# Patient Record
Sex: Female | Born: 1970
Health system: Southern US, Community
[De-identification: ages and names within clinical notes are randomized; demographics above are authoritative.]

## PROBLEM LIST (undated history)

## (undated) DIAGNOSIS — E538 Deficiency of other specified B group vitamins: Secondary | ICD-10-CM

## (undated) DIAGNOSIS — G250 Essential tremor: Secondary | ICD-10-CM

## (undated) DIAGNOSIS — E785 Hyperlipidemia, unspecified: Secondary | ICD-10-CM

## (undated) HISTORY — DX: Deficiency of other specified B group vitamins: E53.8

## (undated) HISTORY — DX: Essential tremor: G25.0

## (undated) HISTORY — DX: Hyperlipidemia, unspecified: E78.5

---

## 2007-06-17 HISTORY — PX: BREAST EXCISIONAL BIOPSY: SUR124

## 2010-06-04 ENCOUNTER — Encounter
Admission: RE | Admit: 2010-06-04 | Discharge: 2010-06-04 | Payer: Self-pay | Source: Home / Self Care | Attending: Family Medicine | Admitting: Family Medicine

## 2015-09-21 ENCOUNTER — Other Ambulatory Visit: Payer: Self-pay | Admitting: Family Medicine

## 2015-09-21 DIAGNOSIS — Z1231 Encounter for screening mammogram for malignant neoplasm of breast: Secondary | ICD-10-CM

## 2015-12-28 ENCOUNTER — Ambulatory Visit: Payer: Self-pay | Attending: Internal Medicine

## 2016-01-31 ENCOUNTER — Ambulatory Visit: Payer: Self-pay | Admitting: Family Medicine

## 2016-02-01 ENCOUNTER — Ambulatory Visit: Payer: Medicaid Other | Attending: Internal Medicine

## 2016-02-05 ENCOUNTER — Encounter: Payer: Self-pay | Admitting: Family Medicine

## 2016-02-05 ENCOUNTER — Ambulatory Visit (INDEPENDENT_AMBULATORY_CARE_PROVIDER_SITE_OTHER): Payer: No Typology Code available for payment source | Admitting: Family Medicine

## 2016-02-05 VITALS — BP 117/71 | HR 83 | Temp 98.5°F | Resp 18 | Ht 61.0 in | Wt 157.0 lb

## 2016-02-05 DIAGNOSIS — Z0000 Encounter for general adult medical examination without abnormal findings: Secondary | ICD-10-CM

## 2016-02-05 LAB — CBC WITH DIFFERENTIAL/PLATELET
BASOS ABS: 0 {cells}/uL (ref 0–200)
Basophils Relative: 0 %
EOS ABS: 204 {cells}/uL (ref 15–500)
Eosinophils Relative: 2 %
HEMATOCRIT: 36.6 % (ref 35.0–45.0)
Hemoglobin: 11.7 g/dL (ref 11.7–15.5)
LYMPHS PCT: 18 %
Lymphs Abs: 1836 cells/uL (ref 850–3900)
MCH: 26.2 pg — ABNORMAL LOW (ref 27.0–33.0)
MCHC: 32 g/dL (ref 32.0–36.0)
MCV: 82.1 fL (ref 80.0–100.0)
MONO ABS: 714 {cells}/uL (ref 200–950)
MPV: 9.5 fL (ref 7.5–12.5)
Monocytes Relative: 7 %
NEUTROS PCT: 73 %
Neutro Abs: 7446 cells/uL (ref 1500–7800)
Platelets: 371 10*3/uL (ref 140–400)
RBC: 4.46 MIL/uL (ref 3.80–5.10)
RDW: 14 % (ref 11.0–15.0)
WBC: 10.2 10*3/uL (ref 3.8–10.8)

## 2016-02-05 LAB — LIPID PANEL
Cholesterol: 188 mg/dL (ref 125–200)
HDL: 56 mg/dL (ref >=46)
LDL Cholesterol: 105 mg/dL (ref <130)
TRIGLYCERIDES: 133 mg/dL (ref <150)
Total CHOL/HDL Ratio: 3.4 Ratio (ref <=5.0)
VLDL: 27 mg/dL (ref <30)

## 2016-02-05 LAB — COMPLETE METABOLIC PANEL WITH GFR
ALK PHOS: 71 U/L (ref 33–115)
ALT: 12 U/L (ref 6–29)
AST: 14 U/L (ref 10–35)
Albumin: 3.5 g/dL — ABNORMAL LOW (ref 3.6–5.1)
BUN: 13 mg/dL (ref 7–25)
CALCIUM: 8.4 mg/dL — AB (ref 8.6–10.2)
CHLORIDE: 104 mmol/L (ref 98–110)
CO2: 24 mmol/L (ref 20–31)
CREATININE: 0.67 mg/dL (ref 0.50–1.10)
GFR, Est Non African American: >89 mL/min (ref >=60)
Glucose, Bld: 88 mg/dL (ref 65–99)
POTASSIUM: 4.1 mmol/L (ref 3.5–5.3)
Sodium: 138 mmol/L (ref 135–146)
Total Bilirubin: 0.2 mg/dL (ref 0.2–1.2)
Total Protein: 6 g/dL — ABNORMAL LOW (ref 6.1–8.1)

## 2016-02-05 MED ORDER — BACLOFEN 10 MG PO TABS: 10.0000 mg | ORAL_TABLET | Freq: Three times a day (TID) | ORAL | 0 refills | Status: DC

## 2016-02-05 MED ORDER — NAPROXEN 500 MG PO TABS: 500.0000 mg | ORAL_TABLET | Freq: Two times a day (BID) | ORAL | 1 refills | Status: DC

## 2016-02-05 NOTE — Progress Notes (Signed)
Patient is here to establish care  Patient denies pain at this time.  Patient is not taking any current medications.

## 2016-02-06 LAB — HEMOGLOBIN A1C
HEMOGLOBIN A1C: 5.4 % (ref <5.7)
MEAN PLASMA GLUCOSE: 108 mg/dL

## 2016-02-06 LAB — HIV ANTIBODY (ROUTINE TESTING W REFLEX): HIV: NONREACTIVE

## 2016-02-06 NOTE — Patient Instructions (Signed)
We will let you know if anything in your bloodwork needs attention Will refer to dentist. s

## 2016-02-06 NOTE — Progress Notes (Signed)
Madison MortonMiguelina Risk, is a 45 y.o. female  ZOX:[id number]:[id number]  WJX:[id number]  DOB - 1971/03/03  CC:  Chief Complaint  Patient presents with  . Establish Care       HPI: Madison Moses is a 45 y.o. female here to establish care. She c/o of chronic back pain with intermittent spasms.  She is in need of health maintenance.  She reports her Tdap is up to date. She declines influenza, She needs screening for diabetes with A1C. She needs screening for HIV. She reports having a PAP in January.She is in need  Of a mammogram.She also asks for a referral to eye doctor. She is currently on no regular medications. She denies tobacco, alcohol and drug use.  No Known Allergies History reviewed. No pertinent past medical history. No current outpatient prescriptions on file prior to visit.   No current facility-administered medications on file prior to visit.    History reviewed. No pertinent family history. Social History   Social History  . Marital status: Married    Spouse name: N/A  . Number of children: N/A  . Years of education: N/A   Occupational History  . Not on file.   Social History Main Topics  . Smoking status: Never Smoker  . Smokeless tobacco: Never Used  . Alcohol use Not on file  . Drug use: No  . Sexual activity: Yes   Other Topics Concern  . Not on file   Social History Narrative  . No narrative on file    Review of Systems: Constitutional: Negative for fever, chills, appetite change, Fatigue.She reports a 20# weight gain. Skin: Negative for rashes or lesions of concern. HENT: Negative for ear pain, ear discharge.nose bleeds Eyes: Negative for pain, discharge, redness, itching and visual disturbance. Neck: Negative for pain, stiffness Respiratory: Negative for cough, shortness of breath,   Cardiovascular: Negative for chest pain, palpitations and leg swelling. Gastrointestinal: Negative for abdominal pain, nausea, vomiting, diarrhea. Positive for  constipation and heartburn Genitourinary: Negative for dysuria, urgency, frequency, hematuria,  Musculoskeletal: Positive for back pain. Negative for other joint pain, joint  swelling, and gait problem.Negative for weakness. Neurological: Negative for dizziness, tremors, seizures, syncope,   light-headedness, numbness and headaches.  Hematological: Negative for easy bruising or bleeding Psychiatric/Behavioral: Negative for depression, anxiety, decreased concentration, confusion   Objective:   Vitals:   02/05/16 1355  BP: 117/71  Pulse: 83  Resp: 18  Temp: 98.5 F (36.9 C)    Physical Exam: Constitutional: Patient appears well-developed and well-nourished. No distress. HENT: Normocephalic, atraumatic, External right and left ear normal. Oropharynx is clear and moist.  Eyes: Conjunctivae and EOM are normal. PERRLA, no scleral icterus. Neck: Normal ROM. Neck supple. No lymphadenopathy, No thyromegaly. CVS: RRR, S1/S2 +, no murmurs, no gallops, no rubs Pulmonary: Effort and breath sounds normal, no stridor, rhonchi, wheezes, rales.  Abdominal: Soft. Normoactive BS,, no distension, tenderness, rebound or guarding.  Musculoskeletal: Normal range of motion. No edema and no tenderness.  Neuro: Alert.Normal muscle tone coordination. Non-focal Skin: Skin is warm and dry. No rash noted. Not diaphoretic. No erythema. No pallor. Psychiatric: Normal mood and affect. Behavior, judgment, thought content normal.  Lab Results  Component Value Date   WBC 10.2 02/05/2016   HGB 11.7 02/05/2016   HCT 36.6 02/05/2016   MCV 82.1 02/05/2016   PLT 371 02/05/2016   Lab Results  Component Value Date   CREATININE 0.67 02/05/2016   BUN 13 02/05/2016   NA 138 02/05/2016   K  4.1 02/05/2016   CL 104 02/05/2016   CO2 24 02/05/2016    Lab Results  Component Value Date   HGBA1C 5.4 02/05/2016   Lipid Panel     Component Value Date/Time   CHOL 188 02/05/2016 1426   TRIG 133 02/05/2016 1426    HDL 56 02/05/2016 1426   CHOLHDL 3.4 02/05/2016 1426   VLDL 27 02/05/2016 1426   LDLCALC 105 02/05/2016 1426       Assessment and plan:   1. Healthcare maintenance  - CBC with Differential - COMPLETE METABOLIC PANEL WITH GFR - Hemoglobin A1c - Lipid panel - HIV antibody (with reflex) - Ambulatory referral to Dentistry - Ambulatory referral to Ophthalmology  2. Back pain with spasms -naproxen 500 mg, one po bid with food -baclofen 10 mg, one po tid prn spasms.   Return in about 6 months (around 08/07/2016).  The patient was given clear instructions to go to ER or return to medical center if symptoms don't improve, worsen or new problems develop. The patient verbalized understanding.    Henrietta HooverLinda C Bernhardt FNP  02/06/2016, 2:34 PM

## 2016-03-10 MED FILL — NAPROXEN 500 MG TABLET: 500 | 15 days supply | Qty: 30 | Fill #0

## 2016-03-10 MED FILL — BACLOFEN 10 MG TABLET: 10 | 10 days supply | Qty: 30 | Fill #0

## 2016-05-14 ENCOUNTER — Ambulatory Visit: Payer: Self-pay | Attending: Internal Medicine

## 2016-08-11 ENCOUNTER — Ambulatory Visit (INDEPENDENT_AMBULATORY_CARE_PROVIDER_SITE_OTHER): Payer: No Typology Code available for payment source | Admitting: Family Medicine

## 2016-08-11 ENCOUNTER — Encounter: Payer: Self-pay | Admitting: Family Medicine

## 2016-08-11 VITALS — BP 132/72 | HR 79 | Temp 98.0°F | Resp 12 | Ht 61.0 in | Wt 151.0 lb

## 2016-08-11 DIAGNOSIS — R1011 Right upper quadrant pain: Secondary | ICD-10-CM

## 2016-08-11 DIAGNOSIS — K219 Gastro-esophageal reflux disease without esophagitis: Secondary | ICD-10-CM

## 2016-08-11 LAB — COMPLETE METABOLIC PANEL WITH GFR
ALT: 11 U/L (ref 6–29)
AST: 14 U/L (ref 10–35)
Albumin: 3.8 g/dL (ref 3.6–5.1)
Alkaline Phosphatase: 63 U/L (ref 33–115)
BILIRUBIN TOTAL: 0.2 mg/dL (ref 0.2–1.2)
BUN: 13 mg/dL (ref 7–25)
CALCIUM: 9.3 mg/dL (ref 8.6–10.2)
CHLORIDE: 104 mmol/L (ref 98–110)
CO2: 27 mmol/L (ref 20–31)
CREATININE: 0.64 mg/dL (ref 0.50–1.10)
GFR, Est African American: >89 mL/min (ref >=60)
GFR, Est Non African American: >89 mL/min (ref >=60)
Glucose, Bld: 106 mg/dL — ABNORMAL HIGH (ref 65–99)
Potassium: 4.3 mmol/L (ref 3.5–5.3)
Sodium: 139 mmol/L (ref 135–146)
TOTAL PROTEIN: 6.2 g/dL (ref 6.1–8.1)

## 2016-08-11 LAB — LIPID PANEL
Cholesterol: 194 mg/dL (ref <200)
HDL: 52 mg/dL (ref >50)
LDL CALC: 122 mg/dL — AB (ref <100)
Total CHOL/HDL Ratio: 3.7 Ratio (ref <5.0)
Triglycerides: 101 mg/dL (ref <150)
VLDL: 20 mg/dL (ref <30)

## 2016-08-11 LAB — CBC WITH DIFFERENTIAL/PLATELET
Basophils Absolute: 86 cells/uL (ref 0–200)
Basophils Relative: 1 %
EOS ABS: 172 {cells}/uL (ref 15–500)
Eosinophils Relative: 2 %
HEMATOCRIT: 37.4 % (ref 35.0–45.0)
Hemoglobin: 12.1 g/dL (ref 11.7–15.5)
LYMPHS PCT: 25 %
Lymphs Abs: 2150 cells/uL (ref 850–3900)
MCH: 26.3 pg — ABNORMAL LOW (ref 27.0–33.0)
MCHC: 32.4 g/dL (ref 32.0–36.0)
MCV: 81.3 fL (ref 80.0–100.0)
MONO ABS: 516 {cells}/uL (ref 200–950)
MPV: 9.6 fL (ref 7.5–12.5)
Monocytes Relative: 6 %
NEUTROS PCT: 66 %
Neutro Abs: 5676 cells/uL (ref 1500–7800)
Platelets: 356 10*3/uL (ref 140–400)
RBC: 4.6 MIL/uL (ref 3.80–5.10)
RDW: 13.8 % (ref 11.0–15.0)
WBC: 8.6 10*3/uL (ref 3.8–10.8)

## 2016-08-11 MED ORDER — OMEPRAZOLE 20 MG PO CPDR: 20.0000 mg | DELAYED_RELEASE_CAPSULE | Freq: Every day | ORAL | 3 refills | Status: DC

## 2016-08-11 MED FILL — OMEPRAZOLE DR 20 MG CAPSULE: 20 | 30 days supply | Qty: 30 | Fill #0

## 2016-08-11 NOTE — Progress Notes (Signed)
Madison Moses, is a 46 y.o. female  ZOX:[id number]  WJX:[id number]  DOB - 1970/08/24  CC:  Chief Complaint  Patient presents with  . Abdominal Pain    [place], had pain, had ultrasound, showed gallstones, still has episodes of pain and needs referral to evaluate this and possible surgery       HPI: Madison Moses is a 46 y.o. female here for routine 6 month follow-up.  She is on no current medication.  She was recently out of the country, had abdominal pain, had imaging studies and was told she had gallstones. She has continued to have some intermittent symptoms since her return. She reports RUQ pain with radiation to right rib cage and shoulder, some nausea and bloating. She offers up no other complaints. She did have some positives when prompted by her review of systems, which is listed below.  Health Maintenance.  Declines Tdap and flu shot. She is in need of mammogram and PAP. Have given her information from [facility].   No Known Allergies History reviewed. No pertinent past medical history. No current outpatient prescriptions on file prior to visit.   No current facility-administered medications on file prior to visit.    History reviewed. No pertinent family history. Social History   Social History  . Marital status: Married    Spouse name: N/A  . Number of children: N/A  . Years of education: N/A   Occupational History  . Not on file.   Social History Main Topics  . Smoking status: Never Smoker  . Smokeless tobacco: Never Used  . Alcohol use No  . Drug use: No  . Sexual activity: Yes   Other Topics Concern  . Not on file   Social History Narrative  . No narrative on file    Review of Systems: Constitutional: Negative Skin: Negative HENT: Negative  Eyes: Negative  Neck: Negative Respiratory: Negative Cardiovascular: + occ palpitations Gastrointestinal: + abd pain, distention, bloating, nausea, particularly after  lunch Genitourinary: Negative  Musculoskeletal: + rib cage and shoulder pain, which may be MS or related to gallstones. Neurological: Negative  Hematological: Negative  Psychiatric/Behavioral: Negative    Objective:   Vitals:   08/11/16 1439  BP: 132/72  Pulse: 79  Resp: 12  Temp: 98 F (36.7 C)    Physical Exam: Constitutional: Patient appears well-developed and well-nourished. No distress. HENT: Normocephalic, atraumatic, External right and left ear normal. Oropharynx is clear and moist.  Eyes: Conjunctivae and EOM are normal. PERRLA, no scleral icterus. Neck: Normal ROM. Neck supple. No lymphadenopathy, No thyromegaly. CVS: RRR, S1/S2 +, no murmurs, no gallops, no rubs Pulmonary: Effort and breath sounds normal, no stridor, rhonchi, wheezes, rales.  Abdominal: Soft. Normoactive BS,, no distension, rebound or guarding. + for mild generalized tenderness Musculoskeletal: Normal range of motion. No edema and no tenderness.  Neuro: Alert.Normal muscle tone coordination. Non-focal Skin: Skin is warm and dry. No rash noted. Not diaphoretic. No erythema. No pallor. Psychiatric: Normal mood and affect. Behavior, judgment, thought content normal.  Lab Results  Component Value Date   WBC 10.2 02/05/2016   HGB 11.7 02/05/2016   HCT 36.6 02/05/2016   MCV 82.1 02/05/2016   PLT 371 02/05/2016   Lab Results  Component Value Date   CREATININE 0.67 02/05/2016   BUN 13 02/05/2016   NA 138 02/05/2016   K 4.1 02/05/2016   CL 104 02/05/2016   CO2 24 02/05/2016    Lab Results  Component Value Date  HGBA1C 5.4 02/05/2016   Lipid Panel     Component Value Date/Time   CHOL 188 02/05/2016 1426   TRIG 133 02/05/2016 1426   HDL 56 02/05/2016 1426   CHOLHDL 3.4 02/05/2016 1426   VLDL 27 02/05/2016 1426   LDLCALC 105 02/05/2016 1426        Assessment and plan:   1. Right upper quadrant abdominal pain  - COMPLETE METABOLIC PANEL WITH GFR - CBC with Differential - Lipid  panel - US Abdomen Limited RUQ; Future  2. Gastroesophageal reflux disease, esophagitis presence not specified  - omeprazole (PRILOSEC) 20 MG capsule; Take 1 capsule (20 mg total) by mouth daily.  Dispense: 30 capsule; Refill: 3  3. Need for mammogram and PAP smear - information provided for [facility]   Return in about 3 months (around 11/08/2016).  The patient was given clear instructions to go to ER or return to medical center if symptoms don't improve, worsen or new problems develop. The patient verbalized understanding.    Henrietta HooverLinda C Bernhardt FNP  08/11/2016, 3:18 PM

## 2016-08-11 NOTE — Patient Instructions (Addendum)
Will call if anything needed about gallbladder.  Call number provided to schedule mammogram and PAP smear.

## 2016-08-14 ENCOUNTER — Ambulatory Visit (HOSPITAL_COMMUNITY)
Admission: RE | Admit: 2016-08-14 | Discharge: 2016-08-14 | Disposition: A | Discharge status: Home / Self Care | Payer: No Typology Code available for payment source | Source: Ambulatory Visit | Attending: Family Medicine | Admitting: Family Medicine

## 2016-08-14 DIAGNOSIS — K8020 Calculus of gallbladder without cholecystitis without obstruction: Secondary | ICD-10-CM | POA: Insufficient documentation

## 2016-08-14 DIAGNOSIS — R1011 Right upper quadrant pain: Secondary | ICD-10-CM

## 2016-08-18 ENCOUNTER — Telehealth: Payer: Self-pay

## 2016-08-18 NOTE — Telephone Encounter (Signed)
-----   Message from Henrietta HooverLinda C Bernhardt, NP sent at 08/18/2016  8:07 AM EST ----- US shows gallstones but she has orange card. We can refer to surgery but she will have to pay. Any suggestions?

## 2016-08-18 NOTE — Telephone Encounter (Signed)
Called and spoke with patient, advised of ultrasound showing gallstones and that she would need to be referred to a general surgeon for follow up. Patient only has orange card at this time and wants to discuss this with husband before referral is made. Thanks!

## 2016-09-10 ENCOUNTER — Other Ambulatory Visit: Payer: Self-pay | Admitting: Obstetrics and Gynecology

## 2016-09-10 DIAGNOSIS — Z1231 Encounter for screening mammogram for malignant neoplasm of breast: Secondary | ICD-10-CM

## 2016-09-11 ENCOUNTER — Encounter (HOSPITAL_COMMUNITY): Payer: Self-pay

## 2016-09-11 ENCOUNTER — Ambulatory Visit (HOSPITAL_COMMUNITY)
Admission: RE | Admit: 2016-09-11 | Discharge: 2016-09-11 | Disposition: A | Discharge status: Home / Self Care | Payer: Self-pay | Source: Ambulatory Visit | Attending: Obstetrics and Gynecology | Admitting: Obstetrics and Gynecology

## 2016-09-11 ENCOUNTER — Ambulatory Visit
Admission: RE | Admit: 2016-09-11 | Discharge: 2016-09-11 | Disposition: A | Discharge status: Home / Self Care | Payer: Self-pay | Source: Ambulatory Visit | Attending: Obstetrics and Gynecology | Admitting: Obstetrics and Gynecology

## 2016-09-11 VITALS — BP 126/82 | Temp 98.4°F | Ht 62.0 in | Wt 149.8 lb

## 2016-09-11 DIAGNOSIS — Z1239 Encounter for other screening for malignant neoplasm of breast: Secondary | ICD-10-CM

## 2016-09-11 DIAGNOSIS — Z1231 Encounter for screening mammogram for malignant neoplasm of breast: Secondary | ICD-10-CM

## 2016-09-11 NOTE — Patient Instructions (Signed)
Explained breast self awareness with Madison MortonMiguelina Moses. Patient did not need a Pap smear today due to last Pap smear was in January 2017 per patient. Let her know [facility] will cover Pap smears every 3 years unless has a history of abnormal Pap smears. [facility] Appointment scheduled for Thursday, September 11, 2016 at 1610. Let patient know the Breast Center will follow up with her within the next couple weeks with results of mammogram by letter or phone. Madison MortonMiguelina Moses verbalized understanding.  Madison Moses, Madison Maserhristine Poll, RN 4:14 PM

## 2016-09-11 NOTE — Progress Notes (Signed)
No complaints today.   Pap Smear:  Pap smear not completed today. Last Pap smear was in January 2017 and normal per patient. Per patient has no history of an abnormal Pap smear. No Pap smear results are in EPIC.  Physical exam: Breasts Breasts symmetrical. No skin abnormalities bilateral breasts. No nipple retraction bilateral breasts. No nipple discharge bilateral breasts. No lymphadenopathy. No lumps palpated bilateral breasts. No complaints of pain or tenderness on exam. [facility] mamm[facility]. Appointment scheduled for Thursday, September 11, 2016 at 1610.        Pelvic/Bimanual No Pap smear completed today since last Pap smear was in January 2017 per patient. Pap smear not indicated per [facility] guidelines.   Smoking History: Patient has never smoked.  Patient Navigation: Patient education provided. [facility].

## 2016-09-12 ENCOUNTER — Encounter (HOSPITAL_COMMUNITY): Payer: Self-pay | Admitting: *Deleted

## 2016-09-15 ENCOUNTER — Other Ambulatory Visit: Payer: Self-pay | Admitting: Obstetrics and Gynecology

## 2016-09-15 DIAGNOSIS — R928 Other abnormal and inconclusive findings on diagnostic imaging of breast: Secondary | ICD-10-CM

## 2016-09-25 ENCOUNTER — Ambulatory Visit
Admission: RE | Admit: 2016-09-25 | Discharge: 2016-09-25 | Disposition: A | Discharge status: Home / Self Care | Payer: No Typology Code available for payment source | Source: Ambulatory Visit | Attending: Obstetrics and Gynecology | Admitting: Obstetrics and Gynecology

## 2016-09-25 DIAGNOSIS — R928 Other abnormal and inconclusive findings on diagnostic imaging of breast: Secondary | ICD-10-CM

## 2016-09-29 ENCOUNTER — Other Ambulatory Visit: Payer: Self-pay

## 2016-09-29 DIAGNOSIS — Z0000 Encounter for general adult medical examination without abnormal findings: Secondary | ICD-10-CM

## 2016-10-01 ENCOUNTER — Ambulatory Visit: Payer: No Typology Code available for payment source

## 2016-10-01 ENCOUNTER — Other Ambulatory Visit: Payer: No Typology Code available for payment source

## 2016-10-03 ENCOUNTER — Ambulatory Visit: Payer: No Typology Code available for payment source

## 2016-11-11 ENCOUNTER — Ambulatory Visit: Payer: No Typology Code available for payment source | Admitting: Family Medicine

## 2017-05-08 IMAGING — US US ABDOMEN LIMITED
1 series · 14 of 25 positions shown · non-contrast
Comparison: None.

CLINICAL DATA: RIGHT upper quadrant pain.

EXAM:
US ABDOMEN LIMITED - RIGHT UPPER QUADRANT

[Series 1: us abdomen limited · 0.19mm/px · 14 of 61 slices shown]
[im 1/61]
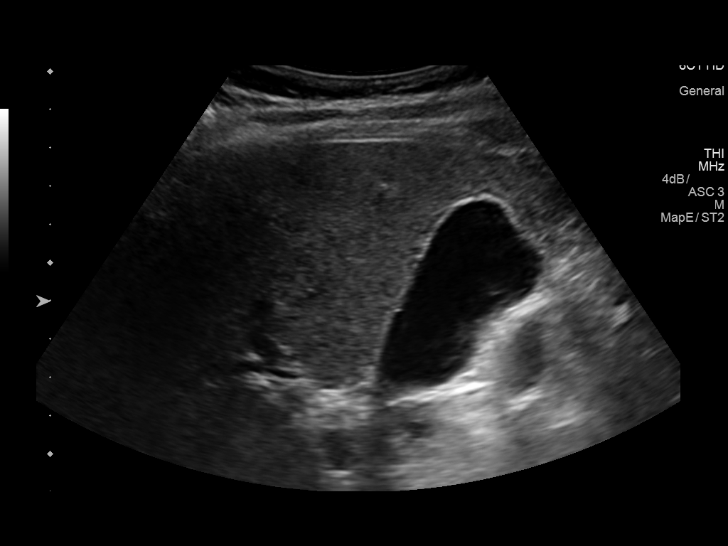
[im 6/61]
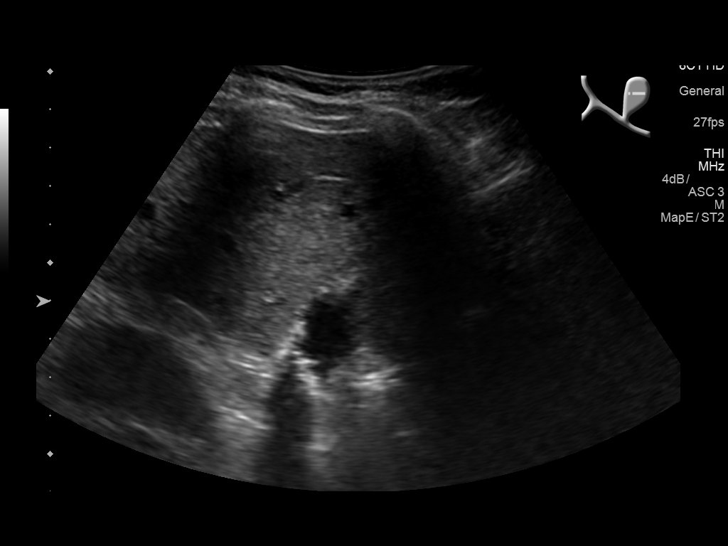
[im 11/61]
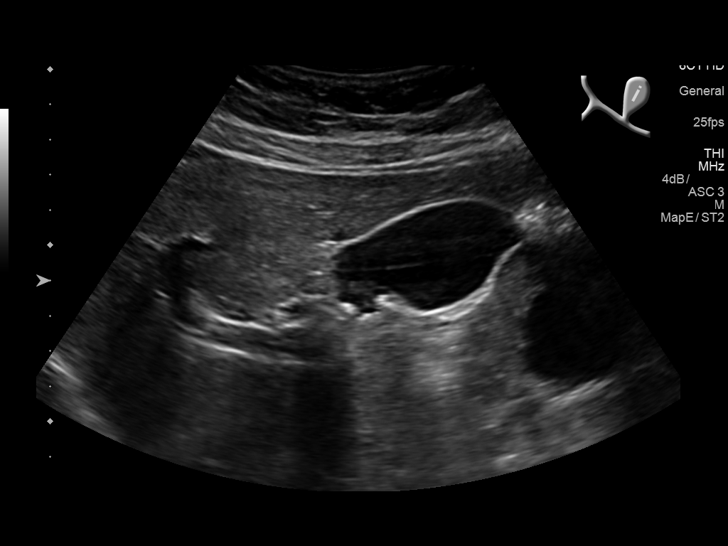
[im 16/61]
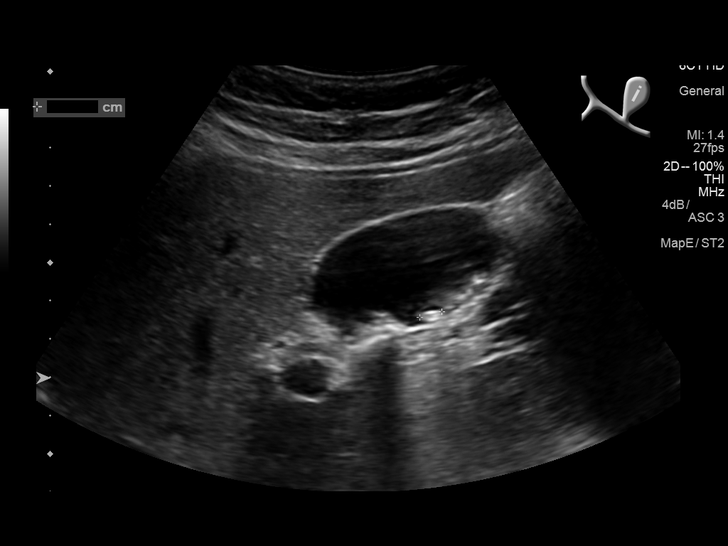
[im 21/61]
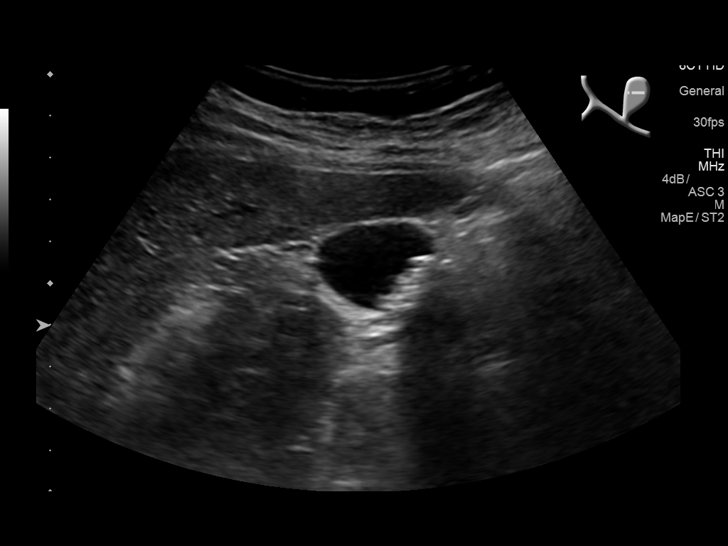
[im 23/61]
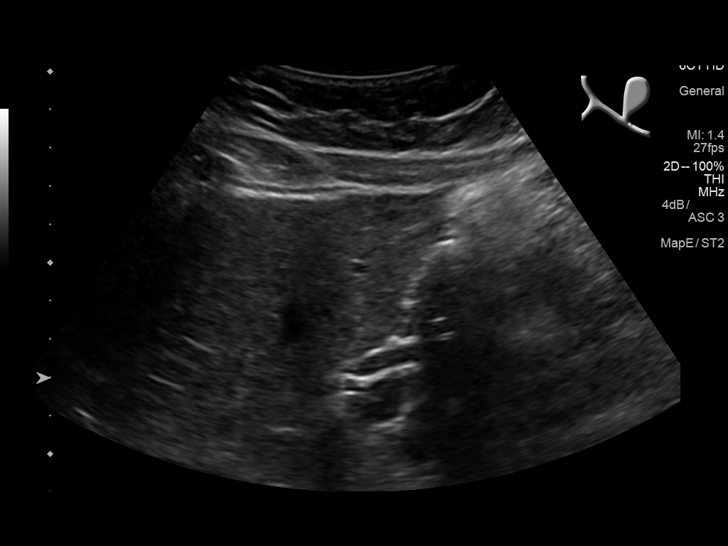
[im 28/61]
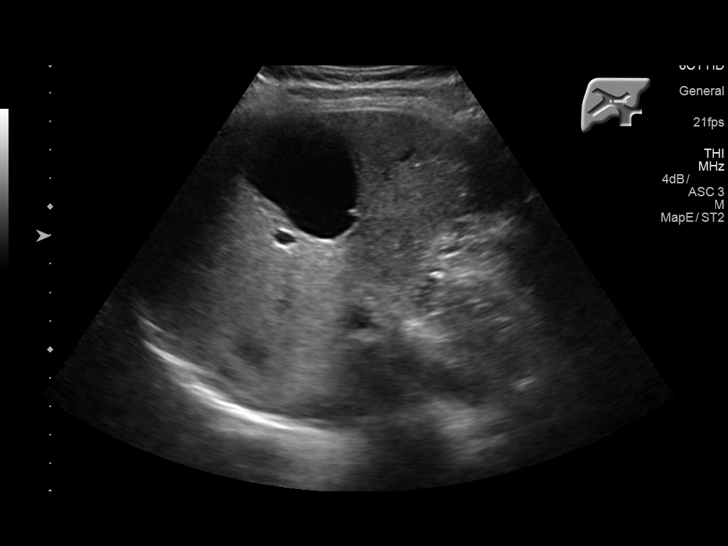
[im 33/61]
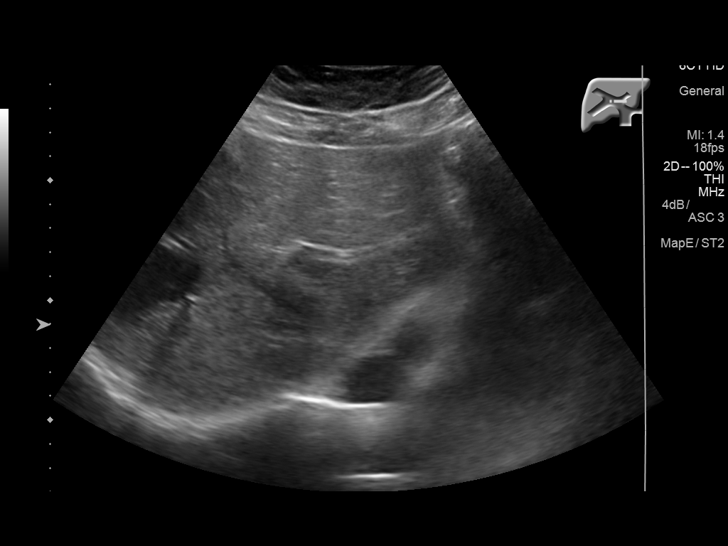
[im 38/61]
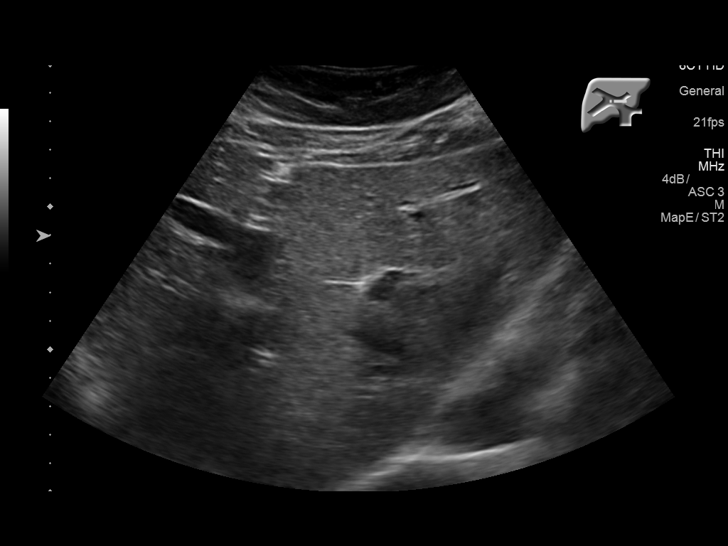
[im 41/61]
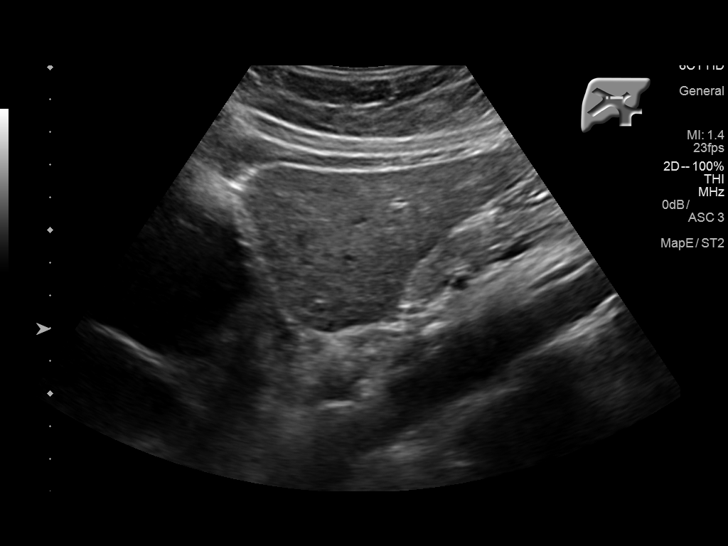
[im 46/61]
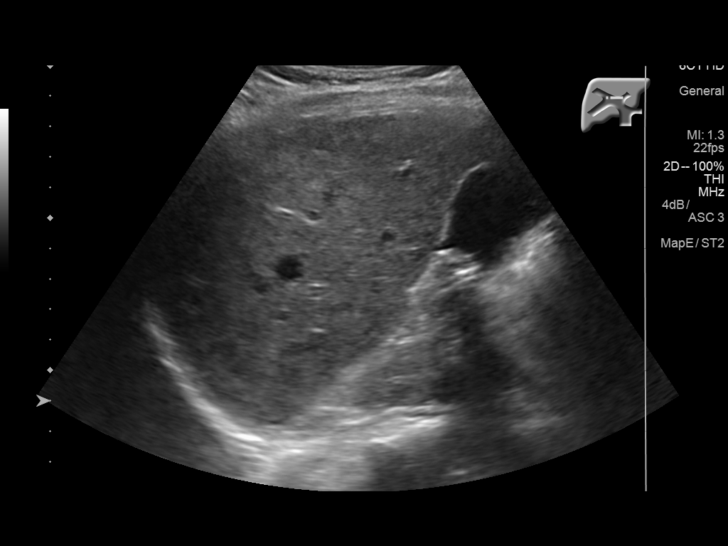
[im 51/61]
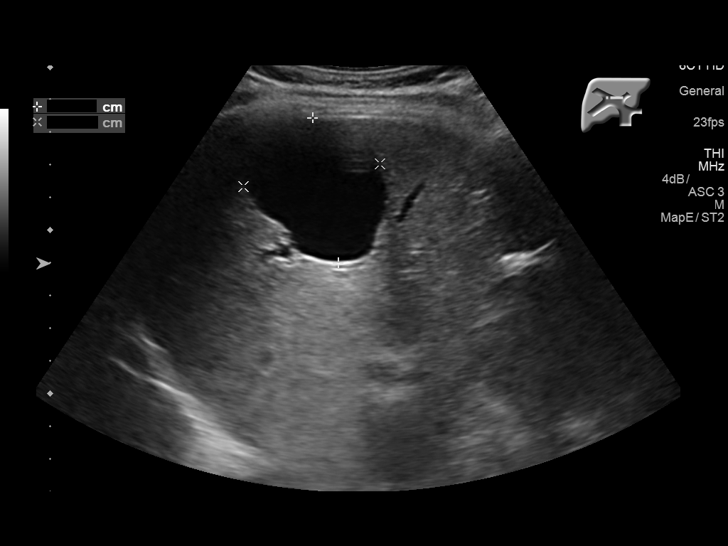
[im 56/61]
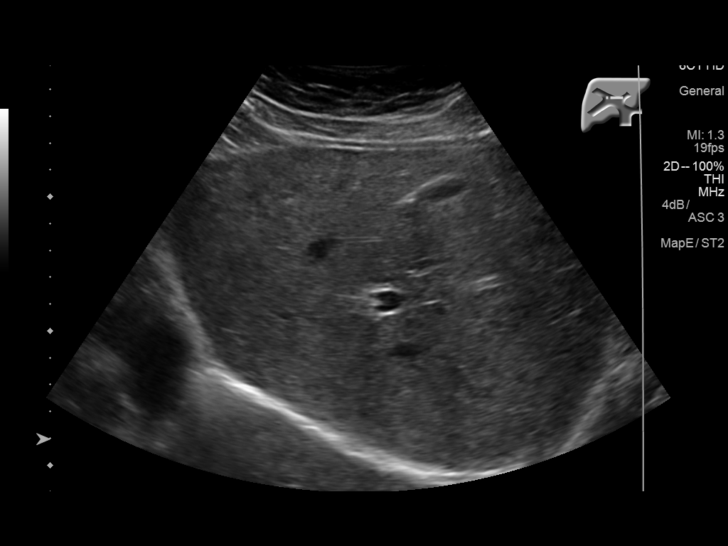
[im 61/61]
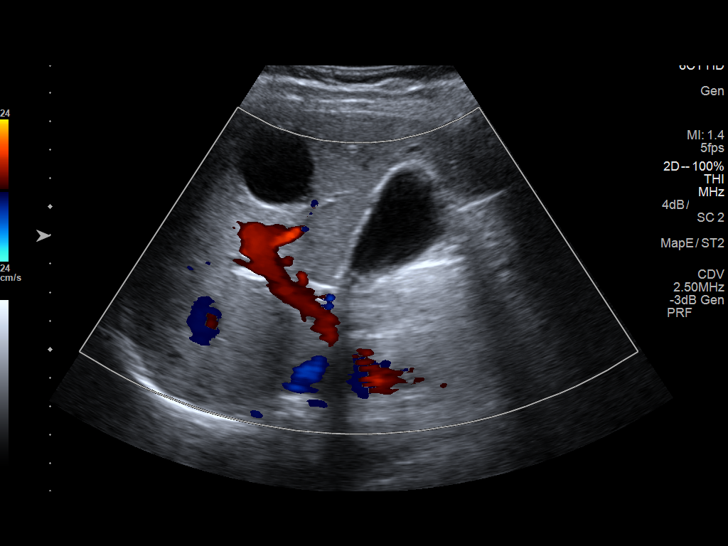

[14 of 25 positions shown; findings below may reference images not displayed]

FINDINGS: Gallbladder:

Multiple gallstones are identified, largest measuring 6 mm. Normal
wall thickness. No cholecystic fluid. No tenderness to sonographic
interrogation.

Common bile duct:

Diameter: Normal measuring 5.1 mm.

Liver:

Normal echogenicity.  4 cm cyst in the RIGHT lobe.
IMPRESSION: Cholelithiasis without features suggestive of acute cholecystitis.
No biliary ductal dilatation.

## 2017-05-08 IMAGING — US US ABDOMEN LIMITED
2 series · 14 of 25 positions shown · non-contrast
Comparison: None.

CLINICAL DATA: RIGHT upper quadrant pain.

EXAM:
US ABDOMEN LIMITED - RIGHT UPPER QUADRANT

[Series 1: us abdomen limited · 0.23mm/px · 9 of 123 slices shown (1 of 2)]
[im 1/123]
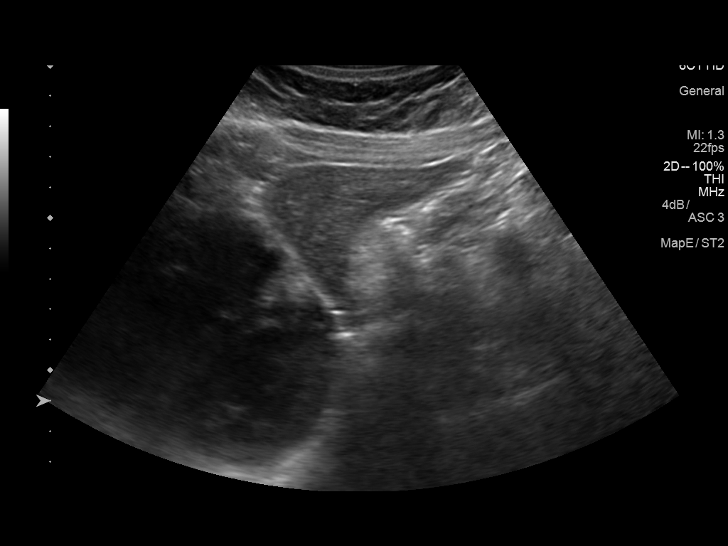
[im 17/123]
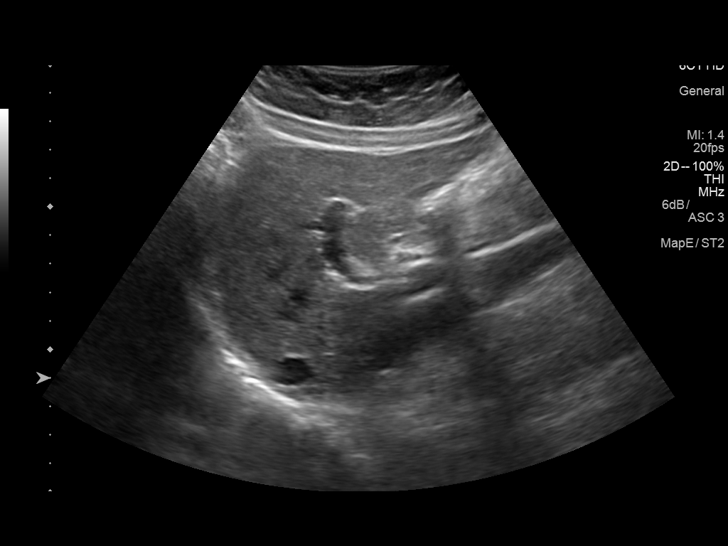
[im 33/123]
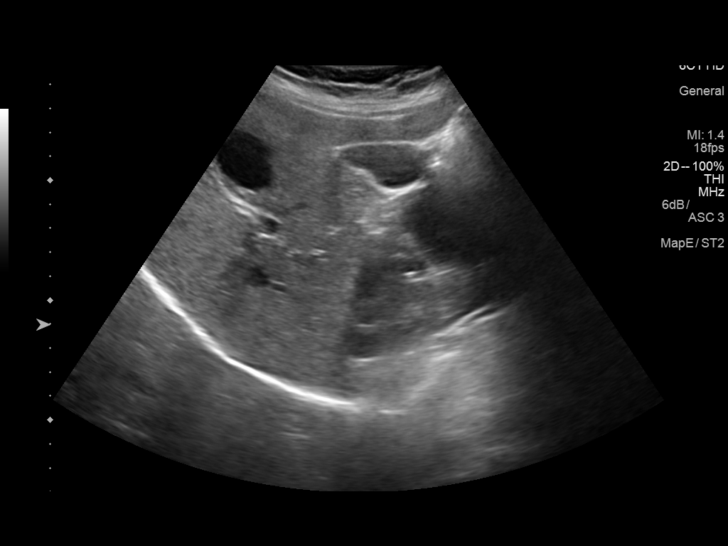
[im 49/123]
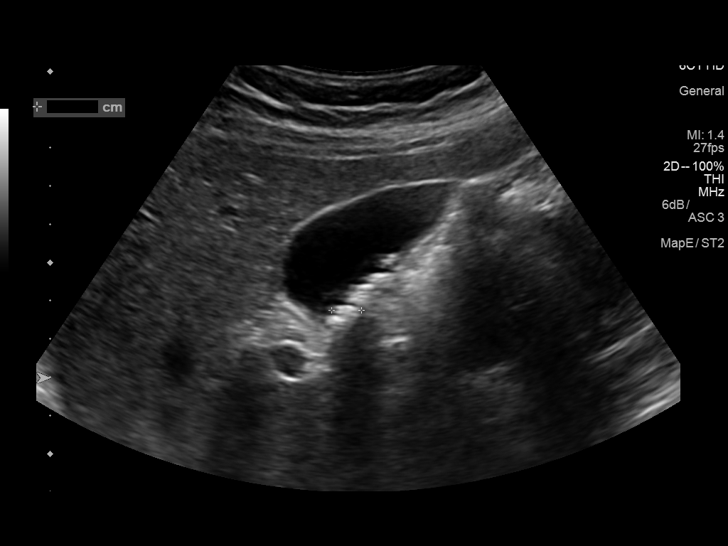
[im 66/123]
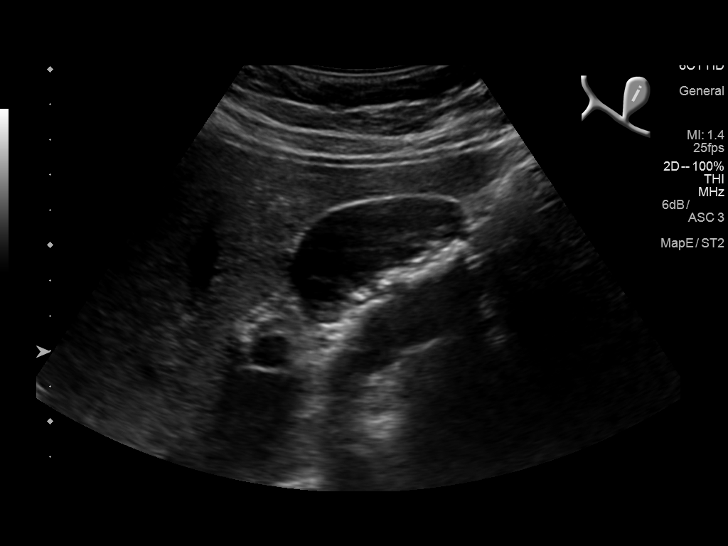
[im 74/123]
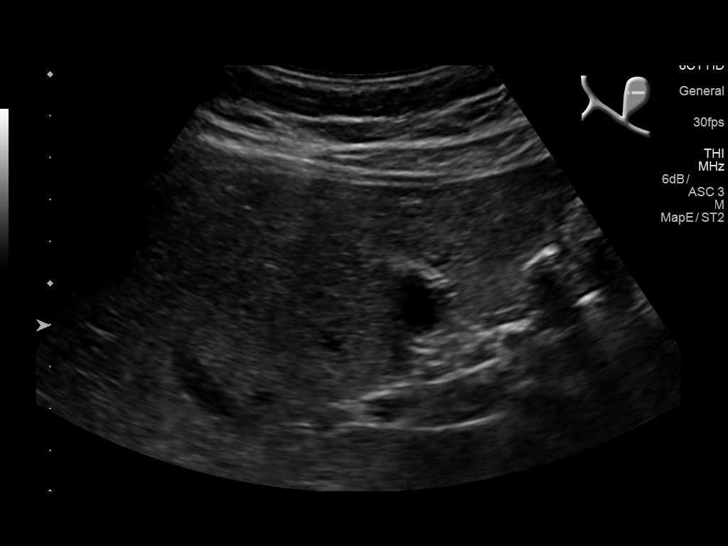
[im 90/123]
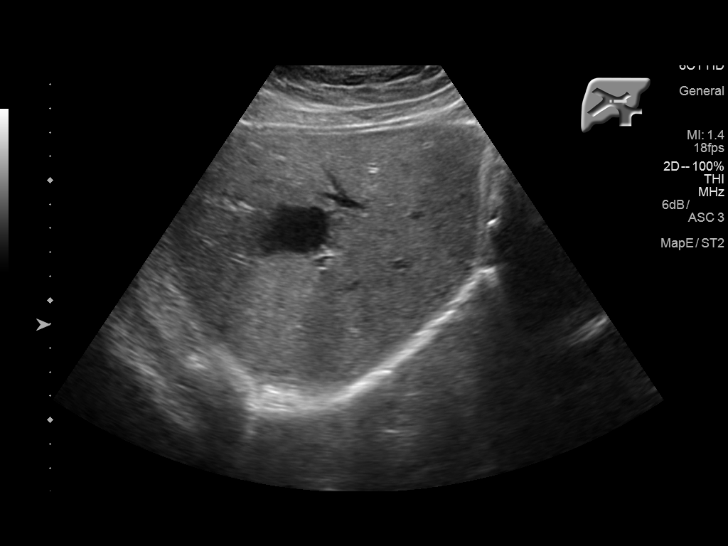
[im 106/123]
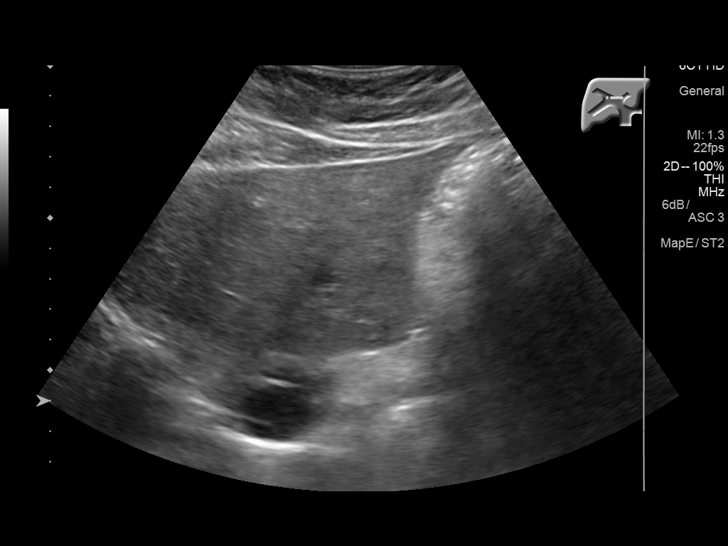
[im 123/123]
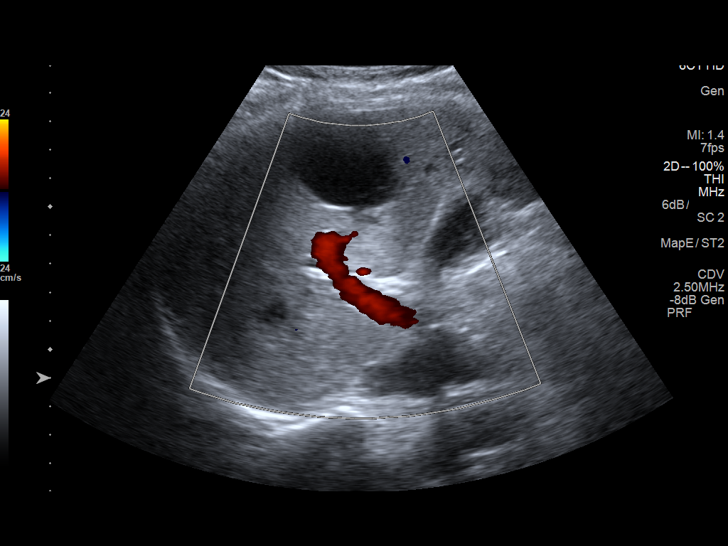

[Series 2: us abdomen limited · 0.23mm/px · 5 of 72 slices shown (2 of 2)]
[im 1/72]
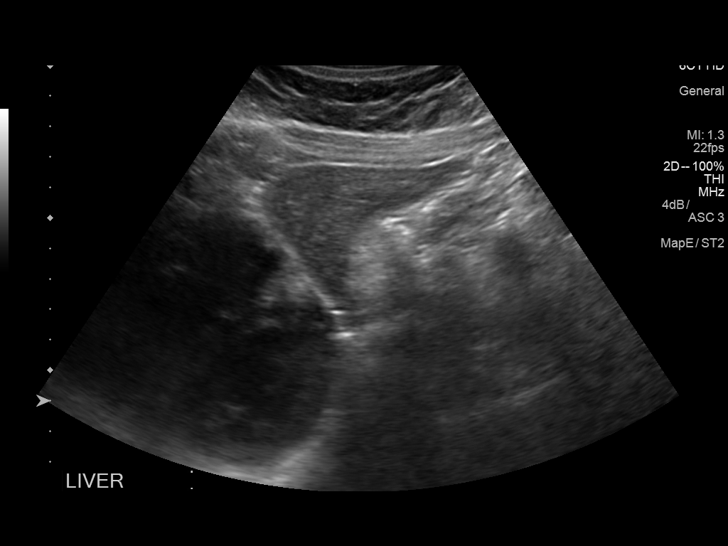
[im 18/72]
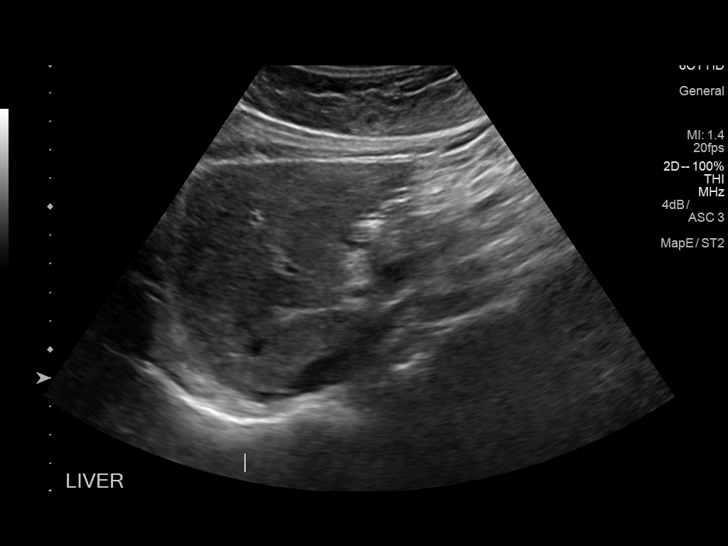
[im 36/72]
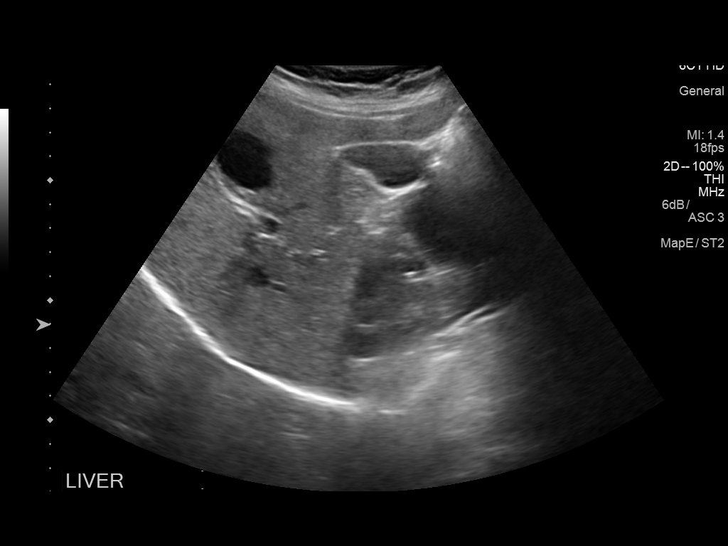
[im 54/72]
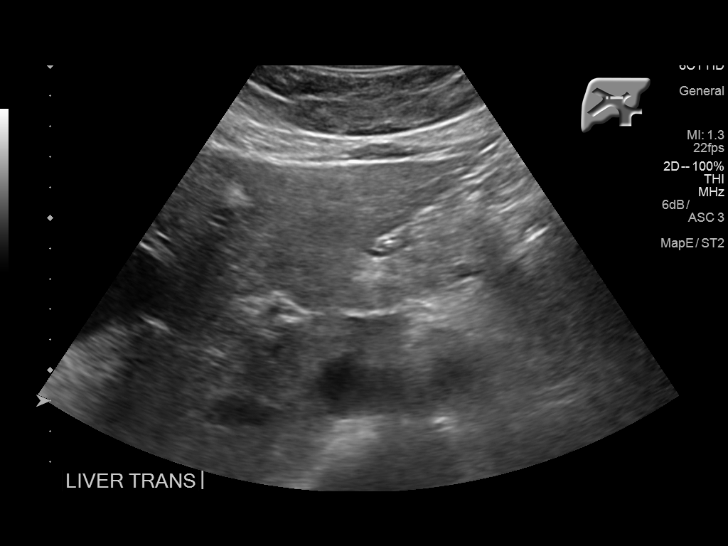
[im 72/72]
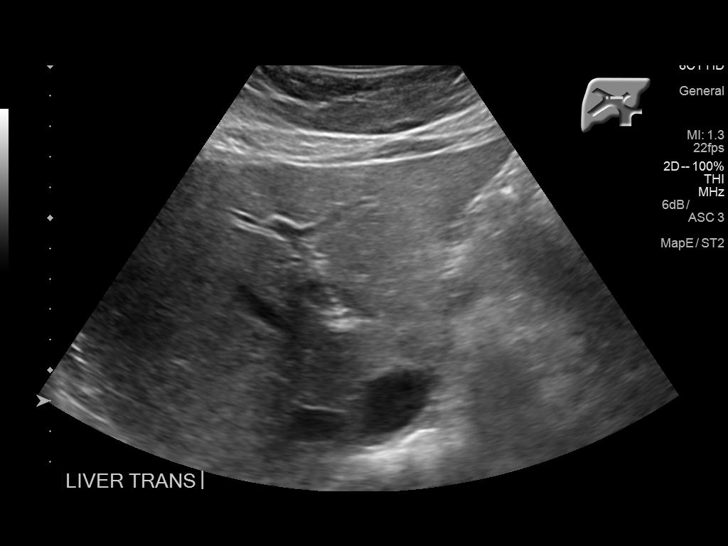

[14 of 25 positions shown; findings below may reference images not displayed]

FINDINGS: Gallbladder:

Multiple gallstones are identified, largest measuring 6 mm. Normal
wall thickness. No cholecystic fluid. No tenderness to sonographic
interrogation.

Common bile duct:

Diameter: Normal measuring 5.1 mm.

Liver:

Normal echogenicity.  4 cm cyst in the RIGHT lobe.
IMPRESSION: Cholelithiasis without features suggestive of acute cholecystitis.
No biliary ductal dilatation.

## 2019-04-05 ENCOUNTER — Other Ambulatory Visit: Payer: Self-pay

## 2019-04-05 ENCOUNTER — Ambulatory Visit (INDEPENDENT_AMBULATORY_CARE_PROVIDER_SITE_OTHER): Payer: Self-pay | Admitting: Family Medicine

## 2019-04-05 ENCOUNTER — Encounter: Payer: Self-pay | Admitting: Family Medicine

## 2019-04-05 VITALS — BP 137/88 | HR 92 | Temp 98.7°F | Ht 62.0 in | Wt 160.6 lb

## 2019-04-05 DIAGNOSIS — M546 Pain in thoracic spine: Secondary | ICD-10-CM

## 2019-04-05 DIAGNOSIS — R300 Dysuria: Secondary | ICD-10-CM

## 2019-04-05 DIAGNOSIS — R5383 Other fatigue: Secondary | ICD-10-CM

## 2019-04-05 DIAGNOSIS — G8929 Other chronic pain: Secondary | ICD-10-CM

## 2019-04-05 DIAGNOSIS — R7989 Other specified abnormal findings of blood chemistry: Secondary | ICD-10-CM

## 2019-04-05 LAB — POCT URINALYSIS DIP (MANUAL ENTRY)
Bilirubin, UA: NEGATIVE
Glucose, UA: NEGATIVE mg/dL
Ketones, POC UA: NEGATIVE mg/dL
Nitrite, UA: NEGATIVE
Protein Ur, POC: NEGATIVE mg/dL
Spec Grav, UA: 1.02 (ref 1.010–1.025)
Urobilinogen, UA: 0.2 E.U./dL
pH, UA: 6 (ref 5.0–8.0)

## 2019-04-05 MED ORDER — CYCLOBENZAPRINE HCL 10 MG PO TABS: 10.0000 mg | ORAL_TABLET | Freq: Three times a day (TID) | ORAL | 0 refills | Status: DC | PRN

## 2019-04-05 MED ORDER — CEPHALEXIN 500 MG PO CAPS: 500.0000 mg | ORAL_CAPSULE | Freq: Two times a day (BID) | ORAL | 0 refills | Status: DC

## 2019-04-05 NOTE — Progress Notes (Signed)
10/20/20208:44 AM  Collins Scotland [birth year], 48 y.o., female [id number]  Chief Complaint  Patient presents with  . Back Pain    HPI:   Patient is a 48 y.o. female who presents today for back pain  Patient has been having recurring back spasms, mostly mid back Several days ago was so intense that it made her crying Chiropractor in the past has told that she has a herniated disc but has never had imaging Paresthesia and weakness of right arm No neck pain No trauma Right handed Currently not working, since feb Non smoker She has been taking ibuprofen of recent Denies any reflux, abd pain, nausea or vomiting She does have GB stones but not having any issues  She is also wondering about unplanned pregnancy She reports that every single pregnancy has had neg urine tests and normal menses and was discovered later due to abd girth and quickening feeling She does have IUD in place but she has become pregnant while on [facility] before She has been having similar abd sensations, to previous quickening Patient feeling tired, fatigued, having to take naps, has gained weight LMP Sept 27th 2020, much heavier and more painful than normal  Has been having dysuria for past several days, has been treating with OTC yeast medication Denies any pelvic pain, hematuria, vaginal discharge, fever , chills, nausea, vomiting   Depression screen PHQ 2/9 04/05/2019  Decreased Interest 0  Down, Depressed, Hopeless 0  PHQ - 2 Score 0    Fall Risk  04/05/2019  Falls in the past year? 0  Number falls in past yr: 0  Injury with Fall? 0     No Known Allergies  Prior to Admission medications   Not on File    No past medical history on file.  Past Surgical History:  Procedure Laterality Date  . BREAST EXCISIONAL BIOPSY Left 2009    Social History   Tobacco Use  . Smoking status: Never Smoker  . Smokeless tobacco: Never Used  Substance Use Topics  . Alcohol use: No    Family History   Problem Relation Age of Onset  . Healthy Mother   . Healthy Father   . Healthy Sister   . Healthy Brother   . Healthy Daughter     Review of Systems  Constitutional: Negative for chills and fever.  Respiratory: Negative for cough and shortness of breath.   Cardiovascular: Negative for chest pain, palpitations and leg swelling.  Gastrointestinal: Negative for abdominal pain, nausea and vomiting.  per hpi   OBJECTIVE:  Today's Vitals   04/05/19 0837  BP: 137/88  Pulse: 92  Temp: 98.7 F (37.1 C)  SpO2: 97%  Weight: 160 lb 9.6 oz (72.8 kg)  Height: 5\' 2"  (1.575 m)   Body mass index is 29.37 kg/m.   Physical Exam Vitals signs and nursing note reviewed.  Constitutional:      Appearance: She is well-developed.  HENT:     Head: Normocephalic and atraumatic.     Mouth/Throat:     Pharynx: No oropharyngeal exudate.  Eyes:     General: No scleral icterus.    Conjunctiva/sclera: Conjunctivae normal.     Pupils: Pupils are equal, round, and reactive to light.  Neck:     Musculoskeletal: Neck supple.  Cardiovascular:     Rate and Rhythm: Normal rate and regular rhythm.     Heart sounds: Normal heart sounds. No murmur. No friction rub. No gallop.   Pulmonary:     Effort:  Pulmonary effort is normal.     Breath sounds: Normal breath sounds. No wheezing or rales.  Abdominal:     General: Bowel sounds are normal. There is no distension.     Palpations: Abdomen is soft. There is no hepatomegaly, splenomegaly or mass.     Tenderness: There is no abdominal tenderness. There is no right CVA tenderness or left CVA tenderness.  Musculoskeletal:     Right shoulder: She exhibits tenderness (upper back/trapezius). She exhibits normal range of motion, no bony tenderness, normal pulse and normal strength (negative neers and hawkins).     Right elbow: Normal.She exhibits normal range of motion. No tenderness (neg elbow flexion test) found.     Right wrist: Normal. She exhibits normal  range of motion and no tenderness (neg tinesl and phalen).     Cervical back: Normal.     Thoracic back: She exhibits tenderness (bilateral paraspinals) and spasm. She exhibits normal range of motion and no bony tenderness.  Skin:    General: Skin is warm and dry.  Neurological:     Mental Status: She is alert and oriented to person, place, and time.     Results for orders placed or performed in visit on 04/05/19 (from the past 24 hour(s))  POCT urinalysis dipstick     Status: Abnormal   Collection Time: 04/05/19  8:46 AM  Result Value Ref Range   Color, UA yellow yellow   Clarity, UA clear clear   Glucose, UA negative negative mg/dL   Bilirubin, UA negative negative   Ketones, POC UA negative negative mg/dL   Spec Grav, UA 3.151 7.616 - 1.025   Blood, UA trace-intact (A) negative   pH, UA 6.0 5.0 - 8.0   Protein Ur, POC negative negative mg/dL   Urobilinogen, UA 0.2 0.2 or 1.0 E.U./dL   Nitrite, UA Negative Negative   Leukocytes, UA Large (3+) (A) Negative    No results found.   ASSESSMENT and PLAN  1. Chronic bilateral thoracic back pain Discussed supportive measures, new meds r/se/b and RTC precautions. Patient educational handout given.  2. Fatigue, unspecified type Labs pending - Beta hCG quant (ref lab) - TSH - CBC  3. Dysuria +3 LCE on UA with scant blood, empiric treatment with keflex. cx pending. RTC precautions reviewed. - POCT urinalysis dipstick - Urine Culture  Other orders - cephALEXin (KEFLEX) 500 MG capsule; Take 1 capsule (500 mg total) by mouth 2 (two) times daily. - cyclobenzaprine (FLEXERIL) 10 MG tablet; Take 1 tablet (10 mg total) by mouth 3 (three) times daily as needed for muscle spasms.  Return if symptoms worsen or fail to improve.    Myles Lipps, MD Primary Care at [facility] [address] [place], [place] [address] Ph.  [phone] Fax [phone]

## 2019-04-05 NOTE — Patient Instructions (Signed)
° ° ° °  If you have lab work done today you will be contacted with your lab results within the next 2 weeks.  If you have not heard from us then please contact us. The fastest way to get your results is to register for My Chart. ° ° °IF you received an x-ray today, you will receive an invoice from [facility]. Please contact [facility] at [phone] with questions or concerns regarding your invoice.  ° °IF you received labwork today, you will receive an invoice from [facility]. Please contact [facility] at [phone] with questions or concerns regarding your invoice.  ° °Our billing staff will not be able to assist you with questions regarding bills from these companies. ° °You will be contacted with the lab results as soon as they are available. The fastest way to get your results is to activate your My Chart account. Instructions are located on the last page of this paperwork. If you have not heard from us regarding the results in 2 weeks, please contact this office. °  ° ° ° °

## 2019-04-06 LAB — CBC
Hematocrit: 37.6 % (ref 34.0–46.6)
Hemoglobin: 12.6 g/dL (ref 11.1–15.9)
MCH: 26.9 pg (ref 26.6–33.0)
MCHC: 33.5 g/dL (ref 31.5–35.7)
MCV: 80 fL (ref 79–97)
Platelets: 366 10*3/uL (ref 150–450)
RBC: 4.68 x10E6/uL (ref 3.77–5.28)
RDW: 13.2 % (ref 11.7–15.4)
WBC: 9.4 10*3/uL (ref 3.4–10.8)

## 2019-04-06 LAB — TSH: TSH: 6.14 u[IU]/mL — ABNORMAL HIGH (ref 0.450–4.500)

## 2019-04-06 LAB — BETA HCG QUANT (REF LAB): hCG Quant: <1 m[IU]/mL

## 2019-04-06 LAB — URINE CULTURE: Organism ID, Bacteria: NO GROWTH

## 2019-04-07 NOTE — Progress Notes (Signed)
Pt has nv, orders are in. Will make appt after results

## 2019-04-07 NOTE — Addendum Note (Signed)
Addended by: Rutherford Guys on: 04/07/2019 08:24 AM   Modules accepted: Orders

## 2019-04-11 ENCOUNTER — Other Ambulatory Visit: Payer: Self-pay

## 2019-04-11 ENCOUNTER — Ambulatory Visit (INDEPENDENT_AMBULATORY_CARE_PROVIDER_SITE_OTHER): Payer: Self-pay | Admitting: Family Medicine

## 2019-04-11 DIAGNOSIS — R7989 Other specified abnormal findings of blood chemistry: Secondary | ICD-10-CM

## 2019-04-12 LAB — T4, FREE: Free T4: 1.06 ng/dL (ref 0.82–1.77)

## 2019-07-16 ENCOUNTER — Encounter: Payer: Self-pay | Admitting: Family Medicine

## 2019-08-08 ENCOUNTER — Other Ambulatory Visit: Payer: Self-pay

## 2019-08-08 ENCOUNTER — Encounter: Payer: Self-pay | Admitting: Family Medicine

## 2019-08-08 ENCOUNTER — Ambulatory Visit (INDEPENDENT_AMBULATORY_CARE_PROVIDER_SITE_OTHER): Payer: Self-pay | Admitting: Family Medicine

## 2019-08-08 VITALS — BP 133/87 | HR 91 | Temp 97.7°F | Ht 62.0 in | Wt 166.2 lb

## 2019-08-08 DIAGNOSIS — N760 Acute vaginitis: Secondary | ICD-10-CM

## 2019-08-08 LAB — POCT WET + KOH PREP
Trich by wet prep: ABSENT
Yeast by KOH: ABSENT
Yeast by wet prep: ABSENT

## 2019-08-08 MED ORDER — NYSTATIN 100000 UNIT/GM EX POWD: 1.0000 "application " | Freq: Three times a day (TID) | CUTANEOUS | 1 refills | Status: DC

## 2019-08-08 MED FILL — NYSTATIN 100000 UNIT/GM POW: 100000 | 7 days supply | Qty: 15 | Fill #0

## 2019-08-08 NOTE — Patient Instructions (Addendum)
If you have lab work done today you will be contacted with your lab results within the next 2 weeks.  If you have not heard from [place] then please contact us. The fastest way to get your results is to register for My Chart.   IF you received an x-ray today, you will receive an invoice from [facility]. Please contact [facility] at [phone] with questions or concerns regarding your invoice.   IF you received labwork today, you will receive an invoice from [facility]. Please contact [facility] at [phone] with questions or concerns regarding your invoice.   Our billing staff will not be able to assist you with questions regarding bills from these companies.  You will be contacted with the lab results as soon as they are available. The fastest way to get your results is to activate your My Chart account. Instructions are located on the last page of this paperwork. If you have not heard from [place] regarding the results in 2 weeks, please contact this office.      Vaginal Yeast Infection, Adult  Vaginal yeast infection is a condition that causes vaginal discharge as well as soreness, swelling, and redness (inflammation) of the vagina. This is a common condition. Some women get this infection frequently. What are the causes? This condition is caused by a change in the normal balance of the yeast (candida) and bacteria that live in the vagina. This change causes an overgrowth of yeast, which causes the inflammation. What increases the risk? The condition is more likely to develop in women who:  Take antibiotic medicines.  Have diabetes.  Take birth control pills.  Are pregnant.  Douche often.  Have a weak body defense system (immune system).  Have been taking steroid medicines for a long time.  Frequently wear tight clothing. What are the signs or symptoms? Symptoms of this condition include:  White, thick, creamy vaginal discharge.  Swelling, itching,  redness, and irritation of the vagina. The lips of the vagina (vulva) may be affected as well.  Pain or a burning feeling while urinating.  Pain during sex. How is this diagnosed? This condition is diagnosed based on:  Your medical history.  A physical exam.  A pelvic exam. Your health care provider will examine a sample of your vaginal discharge under a microscope. Your health care provider may send this sample for testing to confirm the diagnosis. How is this treated? This condition is treated with medicine. Medicines may be over-the-counter or prescription. You may be told to use one or more of the following:  Medicine that is taken by mouth (orally).  Medicine that is applied as a cream (topically).  Medicine that is inserted directly into the vagina (suppository). Follow these instructions at home:  Lifestyle  Do not have sex until your health care provider approves. Tell your sex partner that you have a yeast infection. That person should go to his or her health care provider and ask if they should also be treated.  Do not wear tight clothes, such as pantyhose or tight pants.  Wear breathable cotton underwear. General instructions  Take or apply over-the-counter and prescription medicines only as told by your health care provider.  Eat more yogurt. This may help to keep your yeast infection from returning.  Do not use tampons until your health care provider approves.  Try taking a sitz bath to help with discomfort. This is a warm water bath that is taken while you are sitting down.  The water should only come up to your hips and should cover your buttocks. Do this 3-4 times per day or as told by your health care provider.  Do not douche.  If you have diabetes, keep your blood sugar levels under control.  Keep all follow-up visits as told by your health care provider. This is important. Contact a health care provider if:  You have a fever.  Your symptoms go away  and then return.  Your symptoms do not get better with treatment.  Your symptoms get worse.  You have new symptoms.  You develop blisters in or around your vagina.  You have blood coming from your vagina and it is not your menstrual period.  You develop pain in your abdomen. Summary  Vaginal yeast infection is a condition that causes discharge as well as soreness, swelling, and redness (inflammation) of the vagina.  This condition is treated with medicine. Medicines may be over-the-counter or prescription.  Take or apply over-the-counter and prescription medicines only as told by your health care provider.  Do not douche. Do not have sex or use tampons until your health care provider approves.  Contact a health care provider if your symptoms do not get better with treatment or your symptoms go away and then return. This information is not intended to replace advice given to you by your health care provider. Make sure you discuss any questions you have with your health care provider. Document Revised: 12/31/2018 Document Reviewed: 10/19/2017 Elsevier Patient Education  2020 [employer]vier Inc[employer]  

## 2019-08-08 NOTE — Progress Notes (Signed)
Established Patient Office Visit  Subjective:  Patient ID: Madison Moses, female    DOB: 22-Feb-1971  Age: 49 y.o. MRN: [id number]  CC:  Chief Complaint  Patient presents with  . Vaginal Itching    2 months (external)  . vaginal rash    1 month (all in the outside area)    HPI Madison Moses presents for   Vaginitis Patient reports that her vagina has a red beefy rash like a diaper rash She states that she tried changing her soap, underwear, and avoided pads or tampons. She reports that she has no vaginal itching inside her vagina just external She reports that she has no dysuria or vaginal discharge Patient's last menstrual period was 07/27/2019.   No past medical history on file.  Past Surgical History:  Procedure Laterality Date  . BREAST EXCISIONAL BIOPSY Left 2009    Family History  Problem Relation Age of Onset  . Healthy Mother   . Healthy Father   . Healthy Sister   . Healthy Brother   . Healthy Daughter     Social History   Socioeconomic History  . Marital status: Married    Spouse name: Not on file  . Number of children: Not on file  . Years of education: Not on file  . Highest education level: Not on file  Occupational History  . Not on file  Tobacco Use  . Smoking status: Never Smoker  . Smokeless tobacco: Never Used  Substance and Sexual Activity  . Alcohol use: No  . Drug use: No  . Sexual activity: Yes    Birth control/protection: I.U.D.  Other Topics Concern  . Not on file  Social History Narrative  . Not on file   Social Determinants of Health   Financial Resource Strain:   . Difficulty of Paying Living Expenses: Not on file  Food Insecurity:   . Worried About Charity fundraiser in the Last Year: Not on file  . Ran Out of Food in the Last Year: Not on file  Transportation Needs:   . Lack of Transportation (Medical): Not on file  . Lack of Transportation (Non-Medical): Not on file  Physical Activity:   . Days of  Exercise per Week: Not on file  . Minutes of Exercise per Session: Not on file  Stress:   . Feeling of Stress : Not on file  Social Connections:   . Frequency of Communication with Friends and Family: Not on file  . Frequency of Social Gatherings with Friends and Family: Not on file  . Attends Religious Services: Not on file  . Active Member of Clubs or Organizations: Not on file  . Attends Archivist Meetings: Not on file  . Marital Status: Not on file  Intimate Partner Violence:   . Fear of Current or Ex-Partner: Not on file  . Emotionally Abused: Not on file  . Physically Abused: Not on file  . Sexually Abused: Not on file    Outpatient Medications Prior to Visit  Medication Sig Dispense Refill  . cyclobenzaprine (FLEXERIL) 10 MG tablet Take 1 tablet (10 mg total) by mouth 3 (three) times daily as needed for muscle spasms. (Patient not taking: Reported on 08/08/2019) 30 tablet 0  . cephALEXin (KEFLEX) 500 MG capsule Take 1 capsule (500 mg total) by mouth 2 (two) times daily. 10 capsule 0   No facility-administered medications prior to visit.    No Known Allergies  ROS Review of Systems Review of  Systems  Constitutional: Negative for activity change, appetite change, chills and fever.  Genitourinary: Negative for difficulty urinating, dysuria, flank pain and hematuria.   See HPI. All other review of systems negative.     Objective:    Physical Exam  BP 133/87   Pulse 91   Temp 97.7 F (36.5 C) (Temporal)   Ht 5\' 2"  (1.575 m)   Wt 166 lb 3.2 oz (75.4 kg)   LMP 07/27/2019   SpO2 96%   BMI 30.40 kg/m  Wt Readings from Last 3 Encounters:  08/08/19 166 lb 3.2 oz (75.4 kg)  04/05/19 160 lb 9.6 oz (72.8 kg)  09/11/16 149 lb 12.8 oz (67.9 kg)   Vaginal exam- Chaperone Present Vulva with erythema, with excoriation, with trace edema Urethral meatus normal appearing without erythema Vagina with scant discharge No CMT, ovaries small and not palpable Uterus  midline, nontender   Health Maintenance Due  Topic Date Due  . PAP SMEAR-Modifier  03/14/1990  . INFLUENZA VACCINE  01/15/2019  . MAMMOGRAM  03/15/2019  . COLONOSCOPY  03/15/2019    There are no preventive care reminders to display for this patient.  Lab Results  Component Value Date   TSH 6.140 (H) 04/05/2019   Lab Results  Component Value Date   WBC 9.4 04/05/2019   HGB 12.6 04/05/2019   HCT 37.6 04/05/2019   MCV 80 04/05/2019   PLT 366 04/05/2019   Lab Results  Component Value Date   NA 139 08/11/2016   K 4.3 08/11/2016   CO2 27 08/11/2016   GLUCOSE 106 (H) 08/11/2016   BUN 13 08/11/2016   CREATININE 0.64 08/11/2016   BILITOT 0.2 08/11/2016   ALKPHOS 63 08/11/2016   AST 14 08/11/2016   ALT 11 08/11/2016   PROT 6.2 08/11/2016   ALBUMIN 3.8 08/11/2016   CALCIUM 9.3 08/11/2016   Lab Results  Component Value Date   CHOL 194 08/11/2016   Lab Results  Component Value Date   HDL 52 08/11/2016   Lab Results  Component Value Date   LDLCALC 122 (H) 08/11/2016   Lab Results  Component Value Date   TRIG 101 08/11/2016   Lab Results  Component Value Date   CHOLHDL 3.7 08/11/2016   Lab Results  Component Value Date   HGBA1C 5.4 02/05/2016      Assessment & Plan:   Problem List Items Addressed This Visit    None    Visit Diagnoses    Vulvovaginitis    -  Primary   Relevant Orders   POCT Wet + KOH Prep     Discussed topical treatment Advised pt to take nystatin tid  No orders of the defined types were placed in this encounter.   Follow-up: No follow-ups on file.    02/07/2016, MD

## 2022-09-16 NOTE — Progress Notes (Signed)
HPI: [name] is a 52 y.o. female, who is here today to establish care.  Former PCP: Dr Leretha Pol.  Per records she has hx of elevated TSH, B12 def, chronic upper back pain, and fatigue. IUD that needed to be changed last year. LMP a week ago, she has regular menses.  Her last physical examination was in 09/2016 in the [place]. She reports having a Pap smear three years ago and a mammogram in 2020.  She is currently taking magnesium, vitamin D, and vitamin B12 supplements.  She maintains a healthy diet, including daily consumption of vegetables, fish, and chicken, and exercises by walking three times a week. She does not consume red meat , it causes muscle/joint pain. She mentions that she has experienced weight gain through the past few years, increasing from 145 to 160 pounds, despite no changes in her diet or exercise routine.   She also experiences leg swelling when traveling on airplanes, no pain or erythema.  Her BP is elevated today, no Hx of HTN. Negative for visual changes, chest pain, dyspnea, palpitation,orthopnea,or PND.  Today she is c/o worsening bilateral hand tremors that she has experienced throughout her life. She reports that the tremors have become more severe recently, making it difficult for her to hold a cup and necessitating the use of both hands.  She denies any associated headaches, tingling, numbness, or weakness.  She notes that the tremors are worse in the morning and improve when she is active.   There is a family history of tremors, but no hx of Parkinson's disease or other neurological conditions.  Lab Results  Component Value Date   TSH 6.140 (H) 04/05/2019   Lab Results  Component Value Date   CHOL 194 08/11/2016   HDL 52 08/11/2016   LDLCALC 122 (H) 08/11/2016   TRIG 101 08/11/2016   CHOLHDL 3.7 08/11/2016   Lab Results  Component Value Date   HGBA1C 5.4 02/05/2016   Review of Systems  Constitutional:  Negative for  activity change, appetite change, chills and fever.  HENT:  Negative for sore throat and trouble swallowing.   Respiratory:  Negative for cough and wheezing.   Gastrointestinal:  Negative for abdominal pain, nausea and vomiting.  Endocrine: Negative for cold intolerance, heat intolerance, polydipsia, polyphagia and polyuria.  Genitourinary:  Negative for decreased urine volume, dysuria and hematuria.  Skin:  Negative for rash.  Neurological:  Negative for syncope and facial asymmetry.  See other pertinent positives and negatives in HPI.  Current Outpatient Medications on File Prior to Visit  Medication Sig Dispense Refill   Cholecalciferol (VITAMIN D3) 125 MCG (5000 UT) capsule Take 5,000 Units by mouth daily.     Cyanocobalamin 5000 MCG CAPS Take by mouth.     MAGNESIUM PO Take 325 mcg by mouth.     nystatin (MYCOSTATIN/NYSTOP) powder Apply 1 application topically 3 (three) times daily. 15 g 1   No current facility-administered medications on file prior to visit.   History reviewed. No pertinent past medical history. No Known Allergies  Family History  Problem Relation Age of Onset   Hypertension Mother    Hyperlipidemia Mother    Diabetes Mother    Healthy Mother    Alzheimer's disease Mother    Heart disease Father    Healthy Father    Heart attack Father    Healthy Sister    Healthy Brother    Healthy Daughter    Social History   Socioeconomic History  Marital status: Married    Spouse name: Not on file   Number of children: Not on file   Years of education: Not on file   Highest education level: Not on file  Occupational History   Not on file  Tobacco Use   Smoking status: Never   Smokeless tobacco: Never  Substance and Sexual Activity   Alcohol use: No   Drug use: No   Sexual activity: Yes    Birth control/protection: I.U.D.  Other Topics Concern   Not on file  Social History Narrative   Not on file   Social Determinants of Health   Financial  Resource Strain: Not on file  Food Insecurity: Not on file  Transportation Needs: Not on file  Physical Activity: Not on file  Stress: Not on file  Social Connections: Not on file   Vitals:   09/19/22 0930 09/19/22 1008  BP: (!) 144/86 (!) 142/90  Pulse: 88   Resp: 12   Temp: 98.3 F (36.8 C)   SpO2: 98%    Wt Readings from Last 3 Encounters:  09/19/22 161 lb 12.8 oz (73.4 kg)  08/08/19 166 lb 3.2 oz (75.4 kg)  04/05/19 160 lb 9.6 oz (72.8 kg)   Body mass index is 29.59 kg/m.  Physical Exam Vitals and nursing note reviewed.  Constitutional:      General: She is not in acute distress.    Appearance: She is well-developed.  HENT:     Head: Normocephalic and atraumatic.     Mouth/Throat:     Mouth: Mucous membranes are moist.     Pharynx: Oropharynx is clear.  Eyes:     Conjunctiva/sclera: Conjunctivae normal.  Cardiovascular:     Rate and Rhythm: Normal rate and regular rhythm.     Pulses:          Dorsalis pedis pulses are 2+ on the right side and 2+ on the left side.     Heart sounds: No murmur heard.    Comments: Trace pitting LE edema, bilateral. Pulmonary:     Effort: Pulmonary effort is normal. No respiratory distress.     Breath sounds: Normal breath sounds.  Abdominal:     Palpations: Abdomen is soft. There is no hepatomegaly or mass.     Tenderness: There is no abdominal tenderness.  Lymphadenopathy:     Cervical: No cervical adenopathy.  Skin:    General: Skin is warm.     Findings: No erythema or rash.  Neurological:     General: No focal deficit present.     Mental Status: She is alert and oriented to person, place, and time.     Cranial Nerves: No cranial nerve deficit.     Motor: Tremor (Not present at rest.) present. No pronator drift.     Coordination: Coordination normal. Finger-Nose-Finger Test normal.     Gait: Gait normal.  Psychiatric:        Mood and Affect: Mood and affect normal.   ASSESSMENT AND PLAN:  Ms. Madison Moses was seen  today for new patient (initial visit).  Diagnoses and all orders for this visit: Lab Results  Component Value Date   VITAMINB12 342 09/19/2022   Lab Results  Component Value Date   ALT 13 09/19/2022   AST 18 09/19/2022   ALKPHOS 72 09/19/2022   BILITOT 0.2 09/19/2022   Lab Results  Component Value Date   CREATININE 0.55 09/19/2022   BUN 12 09/19/2022   NA 137 09/19/2022   K 4.1  09/19/2022   CL 104 09/19/2022   CO2 28 09/19/2022   Lab Results  Component Value Date   WBC 7.0 09/19/2022   HGB 12.5 09/19/2022   HCT 37.9 09/19/2022   MCV 82.8 09/19/2022   PLT 376.0 09/19/2022   Lab Results  Component Value Date   TSH 2.07 09/19/2022    Tremor Assessment & Plan: We discussed possible etiologies. Hx is suggestive of essential tremor. We discussed Dx, prognosis,and treatment options. She agrees with trying Propranolol 20 mg bid, some side effects discussed. F/U in 3 months, before if needed.  Orders: -     T4, free; Future -     TSH; Future -     Comprehensive metabolic panel; Future -     CBC; Future -     Propranolol HCl; Take 1 tablet (20 mg total) by mouth 2 (two) times daily.  Dispense: 60 tablet; Refill: 2 -     Vitamin B12; Future  Elevated blood pressure reading Assessment & Plan: No hx of HTN. We discussed criteria Dx for HTN. Propranolol 20 mg bid added today for tremor. Monitor BP at home. Caution with salt intake. F/U in 2 months, before if needed.   B12 deficiency Assessment & Plan: Continue B12 supplementation. Further recommendations will be given according to B12 result.  Orders: -     Vitamin B12; Future  Overweight (BMI 25.0-29.9) Assessment & Plan: BMI 29. She may need to make some changes in her diet, wt loss can be more difficult as we get older. Consistency with healthy diet and physical activity encouraged.    I spent a total of 48 minutes in both face to face and non face to face activities for this visit on the date of this  encounter. During this time history was obtained and documented, examination was performed, prior labs reviewed, and assessment/plan discussed.  Return in about 3 months (around 12/19/2022).  Ronald Londo G. [place], MD  [facility]. [place] office.

## 2022-09-19 ENCOUNTER — Encounter: Payer: Self-pay | Admitting: Family Medicine

## 2022-09-19 ENCOUNTER — Other Ambulatory Visit: Payer: Self-pay

## 2022-09-19 ENCOUNTER — Ambulatory Visit (INDEPENDENT_AMBULATORY_CARE_PROVIDER_SITE_OTHER): Payer: Self-pay | Admitting: Family Medicine

## 2022-09-19 VITALS — BP 142/90 | HR 88 | Temp 98.3°F | Resp 12 | Ht 62.0 in | Wt 161.8 lb

## 2022-09-19 DIAGNOSIS — R030 Elevated blood-pressure reading, without diagnosis of hypertension: Secondary | ICD-10-CM

## 2022-09-19 DIAGNOSIS — E538 Deficiency of other specified B group vitamins: Secondary | ICD-10-CM

## 2022-09-19 DIAGNOSIS — E663 Overweight: Secondary | ICD-10-CM

## 2022-09-19 DIAGNOSIS — R251 Tremor, unspecified: Secondary | ICD-10-CM

## 2022-09-19 LAB — CBC
HCT: 37.9 % (ref 36.0–46.0)
Hemoglobin: 12.5 g/dL (ref 12.0–15.0)
MCHC: 33 g/dL (ref 30.0–36.0)
MCV: 82.8 fl (ref 78.0–100.0)
Platelets: 376 10*3/uL (ref 150.0–400.0)
RBC: 4.58 Mil/uL (ref 3.87–5.11)
RDW: 13.5 % (ref 11.5–15.5)
WBC: 7 10*3/uL (ref 4.0–10.5)

## 2022-09-19 LAB — TSH: TSH: 2.07 u[IU]/mL (ref 0.35–5.50)

## 2022-09-19 LAB — COMPREHENSIVE METABOLIC PANEL
ALT: 13 U/L (ref 0–35)
AST: 18 U/L (ref 0–37)
Albumin: 4 g/dL (ref 3.5–5.2)
Alkaline Phosphatase: 72 U/L (ref 39–117)
BUN: 12 mg/dL (ref 6–23)
CO2: 28 mEq/L (ref 19–32)
Calcium: 9.2 mg/dL (ref 8.4–10.5)
Chloride: 104 mEq/L (ref 96–112)
Creatinine, Ser: 0.55 mg/dL (ref 0.40–1.20)
GFR: 104.58 mL/min (ref >60.00)
Glucose, Bld: 87 mg/dL (ref 70–99)
Potassium: 4.1 mEq/L (ref 3.5–5.1)
Sodium: 137 mEq/L (ref 135–145)
Total Bilirubin: 0.2 mg/dL (ref 0.2–1.2)
Total Protein: 6.7 g/dL (ref 6.0–8.3)

## 2022-09-19 LAB — T4, FREE: Free T4: 0.93 ng/dL (ref 0.60–1.60)

## 2022-09-19 LAB — VITAMIN B12: Vitamin B-12: 342 pg/mL (ref 211–911)

## 2022-09-19 MED ORDER — PROPRANOLOL HCL 20 MG PO TABS
20.0000 mg | ORAL_TABLET | Freq: Two times a day (BID) | ORAL | 2 refills | Status: DC
  Filled 2022-09-19: qty 60, 30d supply, fill #0

## 2022-09-19 NOTE — Patient Instructions (Addendum)
A few things to remember from today's visit:  Tremor  Elevated blood pressure reading Monitor blood pressure at home, goal less 140/90. Propranolol 20 mg 2 times daily started today.  If you need refills for medications you take chronically, please call your pharmacy. Do not use My Chart to request refills or for acute issues that need immediate attention. If you send a my chart message, it may take a few days to be addressed, specially if I am not in the office.  Please be sure medication list is accurate. If a new problem present, please set up appointment sooner than planned today.

## 2022-09-20 NOTE — Assessment & Plan Note (Signed)
We discussed possible etiologies. Hx is suggestive of essential tremor. We discussed Dx, prognosis,and treatment options. She agrees with trying Propranolol 20 mg bid, some side effects discussed. F/U in 3 months, before if needed.

## 2022-09-20 NOTE — Assessment & Plan Note (Signed)
Continue B12 supplementation. Further recommendations will be given according to B12 result.

## 2022-09-20 NOTE — Assessment & Plan Note (Signed)
BMI 29. She may need to make some changes in her diet, wt loss can be more difficult as we get older. Consistency with healthy diet and physical activity encouraged.

## 2022-09-20 NOTE — Assessment & Plan Note (Signed)
No hx of HTN. We discussed criteria Dx for HTN. Propranolol 20 mg bid added today for tremor. Monitor BP at home. Caution with salt intake. F/U in 2 months, before if needed.

## 2022-12-29 NOTE — Progress Notes (Signed)
HPI: Madison Moses is a 52 y.o. female, who is here today for chronic disease management.  Last seen on 09/19/2022. Last visit propranolol 20 mg twice daily was added to treat tremor and elevated BP.  She states her blood pressure has been normal at home, with a reading of 125/78 this morning. She also mentions that her tremor has greatly improved and no new associated symptoms. She has tolerated medication well and it is even helping with anxiety, feels more relax.  Lab Results  Component Value Date   VITAMINB12 342 09/19/2022   Lab Results  Component Value Date   NA 137 09/19/2022   CL 104 09/19/2022   K 4.1 09/19/2022   CO2 28 09/19/2022   BUN 12 09/19/2022   CREATININE 0.55 09/19/2022   GFR 104.58 09/19/2022   CALCIUM 9.2 09/19/2022   ALBUMIN 4.0 09/19/2022   GLUCOSE 87 09/19/2022   Today she is c/o of a persistent dry cough for the past two weeks. She denies any associated fever,chills,or wheezing. The cough has been particularly bothersome at night, causing her to lose sleep. Negative for heartburn. OTC medications have not been effective.  She reports a history of asthma and has been using Albuterol inhaler more often for the past few days due to recent difficulty breathing. She has previously used Symbicort but is not currently interested in using it again.  She recently underwent several tests in her home country, including a pap smear, breast ultrasound, mammogram, pelvic ultrasound, and blood tests. The results were normal, with the breast ultrasound showing small cysts that were not concerning. She was given vitamin E supplementation.   She has never had a colonoscopy and plans to have one performed by her nephew, a gastroenterologist, during next travel to [place].   Review of Systems  HENT:  Negative for mouth sores and sore throat.   Cardiovascular:  Negative for chest pain, palpitations and leg swelling.  Gastrointestinal:  Negative for  abdominal pain, nausea and vomiting.  Endocrine: Negative for cold intolerance and heat intolerance.  Genitourinary:  Negative for decreased urine volume, dysuria and hematuria.  Musculoskeletal:  Negative for gait problem and myalgias.  Skin:  Negative for rash.  Allergic/Immunologic: Positive for environmental allergies.  Neurological:  Negative for syncope and weakness.  See other pertinent positives and negatives in HPI.  Current Outpatient Medications on File Prior to Visit  Medication Sig Dispense Refill   Cholecalciferol (VITAMIN D3) 125 MCG (5000 UT) capsule Take 5,000 Units by mouth daily.     Cyanocobalamin 5000 MCG CAPS Take by mouth.     MAGNESIUM PO Take 325 mcg by mouth.     No current facility-administered medications on file prior to visit.   History reviewed. No pertinent past medical history. No Known Allergies  Social History   Socioeconomic History   Marital status: Married    Spouse name: Not on file   Number of children: Not on file   Years of education: Not on file   Highest education level: Master's degree (e.g., MA, MS, MEng, MEd, MSW, MBA)  Occupational History   Not on file  Tobacco Use   Smoking status: Never   Smokeless tobacco: Never  Substance and Sexual Activity   Alcohol use: No   Drug use: No   Sexual activity: Yes    Birth control/protection: I.U.D.  Other Topics Concern   Not on file  Social History Narrative   Not on file   Social Determinants of Health  Financial Resource Strain: Low Risk  (12/29/2022)   Overall Financial Resource Strain (CARDIA)    Difficulty of Paying Living Expenses: Not hard at all  Food Insecurity: No Food Insecurity (12/29/2022)   Hunger Vital Sign    Worried About Running Out of Food in the Last Year: Never true    Ran Out of Food in the Last Year: Never true  Transportation Needs: No Transportation Needs (12/29/2022)   PRAPARE - Administrator, Civil Service (Medical): No    Lack of  Transportation (Non-Medical): No  Physical Activity: Insufficiently Active (12/29/2022)   Exercise Vital Sign    Days of Exercise per Week: 3 days    Minutes of Exercise per Session: 30 min  Stress: No Stress Concern Present (12/29/2022)   [employer] of Occupational Health - Occupational Stress Questionnaire    Feeling of Stress : Not at all  Social Connections: Moderately Integrated (12/29/2022)   Social Connection and Isolation Panel [NHANES]    Frequency of Communication with Friends and Family: More than three times a week    Frequency of Social Gatherings with Friends and Family: Once a week    Attends Religious Services: More than 4 times per year    Active Member of [employer] or Organizations: No    Attends Banker Meetings: Not on file    Marital Status: Married    Vitals:   12/30/22 1214  BP: 118/70  Pulse: 80  Resp: 12  Temp: 98.5 F (36.9 C)  SpO2: 94%   Body mass index is 30.54 kg/m.  Physical Exam Vitals and nursing note reviewed.  Constitutional:      General: She is not in acute distress.    Appearance: She is well-developed. She is not ill-appearing.  HENT:     Head: Atraumatic.     Right Ear: Tympanic membrane, ear canal and external ear normal.     Left Ear: Tympanic membrane, ear canal and external ear normal.  Eyes:     Conjunctiva/sclera: Conjunctivae normal.  Cardiovascular:     Rate and Rhythm: Normal rate and regular rhythm.     Pulses:          Dorsalis pedis pulses are 2+ on the right side and 2+ on the left side.     Heart sounds: No murmur heard. Pulmonary:     Effort: Pulmonary effort is normal. No respiratory distress.     Breath sounds: Normal breath sounds. No stridor.  Abdominal:     Palpations: Abdomen is soft. There is no mass.     Tenderness: There is no abdominal tenderness.  Musculoskeletal:     Cervical back: No edema or erythema. No muscular tenderness.  Lymphadenopathy:     Head:     Right side of head: No  submandibular adenopathy.     Left side of head: No submandibular adenopathy.     Cervical: No cervical adenopathy.  Skin:    General: Skin is warm.     Findings: No erythema or rash.  Neurological:     General: No focal deficit present.     Mental Status: She is alert and oriented to person, place, and time.  Psychiatric:        Mood and Affect: Mood and affect normal.   ASSESSMENT AND PLAN:  Ms. Madison Moses was seen today for medical management of chronic issues.  Diagnoses and all orders for this visit:  Asthma in adult, mild intermittent, uncomplicated Assessment & Plan: Mild exacerbation  related to recent viral URI. Today lung auscultation was negative. She is not interested in adding a LABA/ICS like Symbicort. Albuterol inh 2 puff every 6 hours for a week then as needed for wheezing or shortness of breath.   Tremor Assessment & Plan: Problem has greatly improved. Continue Propranolol 20 mg bid, which she has tolerated well. As far as problem does not get worse or new symptoms develop, I think we can continue annual follow ups.  Orders: -     Propranolol HCl; Take 1 tablet (20 mg total) by mouth 2 (two) times daily.  Dispense: 180 tablet; Refill: 3  Cough, unspecified type Explained that cough and congestion can lat a few more days and even weeks after acute URI symptoms resolved. I do not think imaging is needed today. Monitor for new symptoms. Benzonatate for symptom management recommended. Instructed about warning signs.  -     Benzonatate; Take 2 capsules (200 mg total) by mouth 2 (two) times daily as needed for up to 10 days.  Dispense: 20 capsule; Refill: 0  Return in about 1 year (around 12/30/2023) for CPE, chronic problems, Labs.  Liylah Najarro G. [place], MD  [facility]. [place] [facility].

## 2022-12-30 ENCOUNTER — Ambulatory Visit (INDEPENDENT_AMBULATORY_CARE_PROVIDER_SITE_OTHER): Payer: Self-pay | Admitting: Family Medicine

## 2022-12-30 ENCOUNTER — Encounter: Payer: Self-pay | Admitting: Family Medicine

## 2022-12-30 VITALS — BP 118/70 | HR 80 | Temp 98.5°F | Resp 12 | Ht 62.0 in | Wt 167.0 lb

## 2022-12-30 DIAGNOSIS — J4520 Mild intermittent asthma, uncomplicated: Secondary | ICD-10-CM

## 2022-12-30 DIAGNOSIS — R251 Tremor, unspecified: Secondary | ICD-10-CM

## 2022-12-30 DIAGNOSIS — R059 Cough, unspecified: Secondary | ICD-10-CM

## 2022-12-30 MED ORDER — PROPRANOLOL HCL 20 MG PO TABS
20.0000 mg | ORAL_TABLET | Freq: Two times a day (BID) | ORAL | 3 refills | Status: DC
Start: 2022-12-30 — End: 2024-03-08

## 2022-12-30 MED ORDER — BENZONATATE 100 MG PO CAPS
200.0000 mg | ORAL_CAPSULE | Freq: Two times a day (BID) | ORAL | 0 refills | Status: AC | PRN
Start: 2022-12-30 — End: 2023-01-09

## 2022-12-30 NOTE — Patient Instructions (Addendum)
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 Si puede, traigame la copia de examenes que se haga en su pais.  If you need refills for medications you take chronically, please call your pharmacy. Do not use My Chart to request refills or for acute issues that need immediate attention. If you send a my chart message, it may take a few days to be addressed, specially if I am not in the office.  Please be sure medication list is accurate. If a new problem present, please set up appointment sooner than planned today.

## 2023-01-01 NOTE — Assessment & Plan Note (Signed)
Problem has greatly improved. Continue Propranolol 20 mg bid, which she has tolerated well. As far as problem does not get worse or new symptoms develop, I think we can continue annual follow ups.

## 2023-01-01 NOTE — Assessment & Plan Note (Signed)
Mild exacerbation related to recent viral URI. Today lung auscultation was negative. She is not interested in adding a LABA/ICS like Symbicort. Albuterol inh 2 puff every 6 hours for a week then as needed for wheezing or shortness of breath.

## 2023-12-08 DIAGNOSIS — E559 Vitamin D deficiency, unspecified: Secondary | ICD-10-CM | POA: Diagnosis not present

## 2023-12-08 DIAGNOSIS — R635 Abnormal weight gain: Secondary | ICD-10-CM | POA: Diagnosis not present

## 2023-12-08 DIAGNOSIS — E7800 Pure hypercholesterolemia, unspecified: Secondary | ICD-10-CM | POA: Diagnosis not present

## 2023-12-29 DIAGNOSIS — Z6829 Body mass index (BMI) 29.0-29.9, adult: Secondary | ICD-10-CM | POA: Diagnosis not present

## 2023-12-29 DIAGNOSIS — E7800 Pure hypercholesterolemia, unspecified: Secondary | ICD-10-CM | POA: Diagnosis not present

## 2024-01-12 DIAGNOSIS — R251 Tremor, unspecified: Secondary | ICD-10-CM | POA: Diagnosis not present

## 2024-01-12 DIAGNOSIS — E7800 Pure hypercholesterolemia, unspecified: Secondary | ICD-10-CM | POA: Diagnosis not present

## 2024-01-12 DIAGNOSIS — Z6830 Body mass index (BMI) 30.0-30.9, adult: Secondary | ICD-10-CM | POA: Diagnosis not present

## 2024-01-12 DIAGNOSIS — E559 Vitamin D deficiency, unspecified: Secondary | ICD-10-CM | POA: Diagnosis not present

## 2024-01-19 DIAGNOSIS — Z6829 Body mass index (BMI) 29.0-29.9, adult: Secondary | ICD-10-CM | POA: Diagnosis not present

## 2024-01-19 DIAGNOSIS — E7800 Pure hypercholesterolemia, unspecified: Secondary | ICD-10-CM | POA: Diagnosis not present

## 2024-01-26 DIAGNOSIS — N951 Menopausal and female climacteric states: Secondary | ICD-10-CM | POA: Diagnosis not present

## 2024-02-02 DIAGNOSIS — E7800 Pure hypercholesterolemia, unspecified: Secondary | ICD-10-CM | POA: Diagnosis not present

## 2024-02-02 DIAGNOSIS — Z6830 Body mass index (BMI) 30.0-30.9, adult: Secondary | ICD-10-CM | POA: Diagnosis not present

## 2024-02-02 DIAGNOSIS — E559 Vitamin D deficiency, unspecified: Secondary | ICD-10-CM | POA: Diagnosis not present

## 2024-03-08 ENCOUNTER — Encounter: Payer: Self-pay | Admitting: Family Medicine

## 2024-03-08 ENCOUNTER — Ambulatory Visit (INDEPENDENT_AMBULATORY_CARE_PROVIDER_SITE_OTHER): Admitting: Family Medicine

## 2024-03-08 ENCOUNTER — Ambulatory Visit: Payer: Self-pay | Admitting: Family Medicine

## 2024-03-08 VITALS — BP 132/82 | HR 67 | Temp 97.4°F | Resp 16 | Ht 61.02 in | Wt 167.4 lb

## 2024-03-08 DIAGNOSIS — M25521 Pain in right elbow: Secondary | ICD-10-CM | POA: Diagnosis not present

## 2024-03-08 DIAGNOSIS — E538 Deficiency of other specified B group vitamins: Secondary | ICD-10-CM | POA: Diagnosis not present

## 2024-03-08 DIAGNOSIS — Z1159 Encounter for screening for other viral diseases: Secondary | ICD-10-CM

## 2024-03-08 DIAGNOSIS — R030 Elevated blood-pressure reading, without diagnosis of hypertension: Secondary | ICD-10-CM | POA: Diagnosis not present

## 2024-03-08 DIAGNOSIS — R251 Tremor, unspecified: Secondary | ICD-10-CM

## 2024-03-08 DIAGNOSIS — Z1329 Encounter for screening for other suspected endocrine disorder: Secondary | ICD-10-CM | POA: Diagnosis not present

## 2024-03-08 DIAGNOSIS — T783XXS Angioneurotic edema, sequela: Secondary | ICD-10-CM

## 2024-03-08 DIAGNOSIS — Z130 Encounter for screening for diseases of the blood and blood-forming organs and certain disorders involving the immune mechanism: Secondary | ICD-10-CM | POA: Diagnosis not present

## 2024-03-08 DIAGNOSIS — Z0000 Encounter for general adult medical examination without abnormal findings: Secondary | ICD-10-CM | POA: Diagnosis not present

## 2024-03-08 DIAGNOSIS — Z1239 Encounter for other screening for malignant neoplasm of breast: Secondary | ICD-10-CM | POA: Diagnosis not present

## 2024-03-08 DIAGNOSIS — E785 Hyperlipidemia, unspecified: Secondary | ICD-10-CM | POA: Diagnosis not present

## 2024-03-08 DIAGNOSIS — Z13228 Encounter for screening for other metabolic disorders: Secondary | ICD-10-CM | POA: Diagnosis not present

## 2024-03-08 LAB — BASIC METABOLIC PANEL WITH GFR
BUN: 16 mg/dL (ref 6–23)
CO2: 29 meq/L (ref 19–32)
Calcium: 9.5 mg/dL (ref 8.4–10.5)
Chloride: 103 meq/L (ref 96–112)
Creatinine, Ser: 0.56 mg/dL (ref 0.40–1.20)
GFR: 103.06 mL/min (ref >60.00)
Glucose, Bld: 100 mg/dL — ABNORMAL HIGH (ref 70–99)
Potassium: 4.4 meq/L (ref 3.5–5.1)
Sodium: 138 meq/L (ref 135–145)

## 2024-03-08 LAB — LIPID PANEL
Cholesterol: 225 mg/dL — ABNORMAL HIGH (ref 0–200)
HDL: 57.6 mg/dL (ref >39.00)
LDL Cholesterol: 149 mg/dL — ABNORMAL HIGH (ref 0–99)
NonHDL: 167.63
Total CHOL/HDL Ratio: 4
Triglycerides: 93 mg/dL (ref 0.0–149.0)
VLDL: 18.6 mg/dL (ref 0.0–40.0)

## 2024-03-08 LAB — HEMOGLOBIN A1C: Hgb A1c MFr Bld: 6 % (ref 4.6–6.5)

## 2024-03-08 LAB — VITAMIN B12: Vitamin B-12: 820 pg/mL (ref 211–911)

## 2024-03-08 MED ORDER — PROPRANOLOL HCL 20 MG PO TABS: 20.0000 mg | ORAL_TABLET | Freq: Two times a day (BID) | ORAL | 3 refills | Status: AC

## 2024-03-08 NOTE — Assessment & Plan Note (Signed)
Continue current dose of B12 supplementation. Further recommendations according to B12 result.

## 2024-03-08 NOTE — Assessment & Plan Note (Signed)
 Propranolol  20 mg bid has helped. She prefers to hold on neurology referral for now. Monitor for new symptoms. As far as problem does not get worse or new symptoms develop, we can continue annual follow ups.

## 2024-03-08 NOTE — Assessment & Plan Note (Addendum)
Continue non pharmacologic treatment. Further recommendations according to lipid panel result.

## 2024-03-08 NOTE — Assessment & Plan Note (Signed)
 We discussed the importance of regular physical activity and healthy diet for prevention of chronic illness and/or complications. Preventive guidelines reviewed. Vaccination: Prefers to hold on vaccines for now. Ca++ and vit D supplementation to continue. Pap smear 12/2022 and colonoscopy 4 years ago, both negative and done in [place]. Screening mammogram order placed. Next CPE in a year.

## 2024-03-08 NOTE — Patient Instructions (Addendum)
 A few things to remember from today's visit:  Routine general medical examination at a health care facility  Angioedema, sequela - Plan: Ambulatory referral to Immunology  Elevated blood pressure reading - Plan: Basic metabolic panel with GFR  Screening for lipoid disorders - Plan: Lipid panel  Screening for endocrine, metabolic and immunity disorder - Plan: Basic metabolic panel with GFR, Hemoglobin A1c  B12 deficiency - Plan: Vitamin B12  Tremor - Plan: propranolol  (INDERAL ) 20 MG tablet  Encounter for screening for malignant neoplasm of breast, unspecified screening modality - Plan: MM 3D SCREENING MAMMOGRAM BILATERAL BREAST  Right elbow pain - Plan: Ambulatory referral to Physical Therapy  Monitor blood pressure at home.  If you need refills for medications you take chronically, please call your pharmacy. Do not use My Chart to request refills or for acute issues that need immediate attention. If you send a my chart message, it may take a few days to be addressed, specially if I am not in the office.  Please be sure medication list is accurate. If a new problem present, please set up appointment sooner than planned today.  Health Maintenance, Female Adopting a healthy lifestyle and getting preventive care are important in promoting health and wellness. Ask your health care provider about: The right schedule for you to have regular tests and exams. Things you can do on your own to prevent diseases and keep yourself healthy. What should I know about diet, weight, and exercise? Eat a healthy diet  Eat a diet that includes plenty of vegetables, fruits, low-fat dairy products, and lean protein. Do not eat a lot of foods that are high in solid fats, added sugars, or sodium. Maintain a healthy weight Body mass index (BMI) is used to identify weight problems. It estimates body fat based on height and weight. Your health care provider can help determine your BMI and help you achieve  or maintain a healthy weight. Get regular exercise Get regular exercise. This is one of the most important things you can do for your health. Most adults should: Exercise for at least 150 minutes each week. The exercise should increase your heart rate and make you sweat (moderate-intensity exercise). Do strengthening exercises at least twice a week. This is in addition to the moderate-intensity exercise. Spend less time sitting. Even light physical activity can be beneficial. Watch cholesterol and blood lipids Have your blood tested for lipids and cholesterol at 55 years of age, then have this test every 5 years. Have your cholesterol levels checked more often if: Your lipid or cholesterol levels are high. You are older than 55 years of age. You are at high risk for heart disease. What should I know about cancer screening? Depending on your health history and family history, you may need to have cancer screening at various ages. This may include screening for: Breast cancer. Cervical cancer. Colorectal cancer. Skin cancer. Lung cancer. What should I know about heart disease, diabetes, and high blood pressure? Blood pressure and heart disease High blood pressure causes heart disease and increases the risk of stroke. This is more likely to develop in people who have high blood pressure readings or are overweight. Have your blood pressure checked: Every 3-5 years if you are 43-52 years of age. Every year if you are 19 years old or older. Diabetes Have regular diabetes screenings. This checks your fasting blood sugar level. Have the screening done: Once every three years after age 49 if you are at a normal weight and  have a low risk for diabetes. More often and at a younger age if you are overweight or have a high risk for diabetes. What should I know about preventing infection? Hepatitis B If you have a higher risk for hepatitis B, you should be screened for this virus. Talk with your  health care provider to find out if you are at risk for hepatitis B infection. Hepatitis C Testing is recommended for: Everyone born from [birth year] through 1967. Anyone with known risk factors for hepatitis C. Sexually transmitted infections (STIs) Get screened for STIs, including gonorrhea and chlamydia, if: You are sexually active and are younger than 55 years of age. You are older than 55 years of age and your health care provider tells you that you are at risk for this type of infection. Your sexual activity has changed since you were last screened, and you are at increased risk for chlamydia or gonorrhea. Ask your health care provider if you are at risk. Ask your health care provider about whether you are at high risk for HIV. Your health care provider may recommend a prescription medicine to help prevent HIV infection. If you choose to take medicine to prevent HIV, you should first get tested for HIV. You should then be tested every 3 months for as long as you are taking the medicine. Pregnancy If you are about to stop having your period (premenopausal) and you may become pregnant, seek counseling before you get pregnant. Take 400 to 800 micrograms (mcg) of folic acid every day if you become pregnant. Ask for birth control (contraception) if you want to prevent pregnancy. Osteoporosis and menopause Osteoporosis is a disease in which the bones lose minerals and strength with aging. This can result in bone fractures. If you are 68 years old or older, or if you are at risk for osteoporosis and fractures, ask your health care provider if you should: Be screened for bone loss. Take a calcium or vitamin D supplement to lower your risk of fractures. Be given hormone replacement therapy (HRT) to treat symptoms of menopause. Follow these instructions at home: Alcohol use Do not drink alcohol if: Your health care provider tells you not to drink. You are pregnant, may be pregnant, or are planning to  become pregnant. If you drink alcohol: Limit how much you have to: 0-1 drink a day. Know how much alcohol is in your drink. In the [place] one drink equals one 12 oz bottle of beer (355 mL), one 5 oz glass of wine (148 mL), or one 1 oz glass of hard liquor (44 mL). Lifestyle Do not use any products that contain nicotine or tobacco. These products include cigarettes, chewing tobacco, and vaping devices, such as e-cigarettes. If you need help quitting, ask your health care provider. Do not use street drugs. Do not share needles. Ask your health care provider for help if you need support or information about quitting drugs. General instructions Schedule regular health, dental, and eye exams. Stay current with your vaccines. Tell your health care provider if: You often feel depressed. You have ever been abused or do not feel safe at home. Summary Adopting a healthy lifestyle and getting preventive care are important in promoting health and wellness. Follow your health care provider's instructions about healthy diet, exercising, and getting tested or screened for diseases. Follow your health care provider's instructions on monitoring your cholesterol and blood pressure. This information is not intended to replace advice given to you by your health care provider. Make  sure you discuss any questions you have with your health care provider. Document Revised: 10/22/2020 Document Reviewed: 10/22/2020 Elsevier Patient Education  2024 [employer].

## 2024-03-08 NOTE — Progress Notes (Signed)
 Chief Complaint  Patient presents with   Annual Exam   Rash   Joint Pain   Discussed the use of AI scribe software for clinical note transcription with the patient, who gave verbal consent to proceed. History of Present Illness Madison Moses is a 53 year old female with PMHx significant for essential tremor, B12 deficiency, asthma, and elevated BP readings who presents for an annual physical exam. She also wants to discuss some concerns today. She was last seen on 12/30/22.  She engages in regular physical activity, walking four times a week for about 60 minutes each session.  Her diet primarily consists of home-cooked meals, and she has been working with a nutritionist for a year, although she has only lost four pounds. She was on semaglutide, which helped control her appetite but did not result in significant weight loss.   Her sleep has improved, and she is now sleeping six hours per night, which is more than she has in the past.  She does not consume alcohol regularly, only socially every two to three months, and does not smoke.  She sees an eye care provider and dentist regularly.  She sees gynecologist in [place], last pap smear in 12/2022, normal. Colonoscopy about 4 years ago, negative.  There is no immunization history on file for this patient. Health Maintenance  Topic Date Due   Hepatitis C Screening  Never done   DTaP/Tdap/Td (1 - Tdap) Never done   Pneumococcal Vaccine: 50+ Years (1 of 2 - PCV) Never done   Hepatitis B Vaccines 19-59 Average Risk (1 of 3 - 19+ 3-dose series) Never done   Cervical Cancer Screening (HPV/Pap Cotest)  Never done   Colonoscopy  Never done   Zoster Vaccines- Shingrix (1 of 2) Never done   Influenza Vaccine  Never done   COVID-19 Vaccine (1 - 2024-25 season) Never done   Mammogram  11/26/2024   HIV Screening  Completed   HPV VACCINES  Aged Out   Meningococcal B Vaccine  Aged Out    Concerns today:  -Tremors:She is  taking propranolol  20 mg bid, which has significantly improved her symptoms, allowing her to hold objects steadily. No new symptoms related to the tremors have been experienced.  -She has experienced episodes of allergic reactions characterized by redness, pruritus, and swelling from the neck to the face, occurring three times this summer, with the last episode in July. Initially, she associated these episodes with progesterone use, but they recurred even after discontinuing the medication. Episodes usually last about 3-4 days. No associated cough, wheezing,SOB,or stridor. She has been using Benadryl and cooling agents for relief. Asymptomatic at this time.  -She reports a new onset of right shoulder pain since June/2025, which has improved, went down to arm and now localized in posterior aspect of right elbow. The pain is described as muscular ache, and she finds relief with ibuprofen. She suspects the pain may have been triggered by lifting luggage during recent travel but doe snot recall any specific trauma. Exacerbated by certain movements, no limitation of ROM or joint edema/erythema. No neck pain,numbness, or tingling is associated with the arm pain.  BP mildly elevated today, improved after a few minutes.  B12 def: Sh eis on B12 supplementation.  HLD on non pharmacologic treatment. 7 years ago LDL 122.  Review of Systems  Constitutional:  Negative for activity change, appetite change and fever.  HENT:  Negative for hearing loss, mouth sores, sore throat and trouble swallowing.  Eyes:  Negative for redness and visual disturbance.  Respiratory:  Negative for cough, shortness of breath and wheezing.   Cardiovascular:  Negative for chest pain, palpitations and leg swelling.  Gastrointestinal:  Negative for abdominal pain, nausea and vomiting.  Endocrine: Negative for cold intolerance, heat intolerance, polydipsia, polyphagia and polyuria.  Genitourinary:  Negative for decreased urine  volume, dysuria and hematuria.  Musculoskeletal:  Positive for arthralgias. Negative for gait problem.  Skin:  Negative for color change and rash.  Allergic/Immunologic: Negative for environmental allergies.  Neurological:  Positive for tremors. Negative for syncope, weakness and headaches.  Hematological:  Negative for adenopathy. Does not bruise/bleed easily.  Psychiatric/Behavioral:  Negative for confusion. The patient is not nervous/anxious.   All other systems reviewed and are negative.  Current Outpatient Medications on File Prior to Visit  Medication Sig Dispense Refill   Cholecalciferol (VITAMIN D3) 125 MCG (5000 UT) capsule Take 5,000 Units by mouth daily.     Cyanocobalamin 5000 MCG CAPS Take by mouth.     MAGNESIUM PO Take 325 mcg by mouth.     No current facility-administered medications on file prior to visit.   History reviewed. No pertinent past medical history.  Past Surgical History:  Procedure Laterality Date   BREAST EXCISIONAL BIOPSY Left 2009   No Known Allergies  Family History  Problem Relation Age of Onset   Hypertension Mother    Hyperlipidemia Mother    Diabetes Mother    Healthy Mother    Alzheimer's disease Mother    Heart disease Father    Healthy Father    Heart attack Father    Healthy Sister    Healthy Brother    Healthy Daughter    Social History   Socioeconomic History   Marital status: Married    Spouse name: Not on file   Number of children: Not on file   Years of education: Not on file   Highest education level: Master's degree (e.g., MA, MS, MEng, MEd, MSW, MBA)  Occupational History   Not on file  Tobacco Use   Smoking status: Never   Smokeless tobacco: Never  Substance and Sexual Activity   Alcohol use: No   Drug use: No   Sexual activity: Yes    Birth control/protection: I.U.D.  Other Topics Concern   Not on file  Social History Narrative   Not on file   Social Drivers of Health   Financial Resource Strain: Low  Risk  (12/29/2022)   Overall Financial Resource Strain (CARDIA)    Difficulty of Paying Living Expenses: Not hard at all  Food Insecurity: No Food Insecurity (12/29/2022)   Hunger Vital Sign    Worried About Running Out of Food in the Last Year: Never true    Ran Out of Food in the Last Year: Never true  Transportation Needs: No Transportation Needs (12/29/2022)   PRAPARE - Administrator, Civil Service (Medical): No    Lack of Transportation (Non-Medical): No  Physical Activity: Insufficiently Active (12/29/2022)   Exercise Vital Sign    Days of Exercise per Week: 3 days    Minutes of Exercise per Session: 30 min  Stress: No Stress Concern Present (12/29/2022)   [employer] of Occupational Health - Occupational Stress Questionnaire    Feeling of Stress : Not at all  Social Connections: Moderately Integrated (12/29/2022)   Social Connection and Isolation Panel    Frequency of Communication with Friends and Family: More than three times a week  Frequency of Social Gatherings with Friends and Family: Once a week    Attends Religious Services: More than 4 times per year    Active Member of Clubs or Organizations: No    Attends Banker Meetings: Not on file    Marital Status: Married    Vitals:   03/08/24 0931 03/08/24 1023  BP: (!) 146/90 132/82  Pulse: 67   Resp: 16   Temp: (!) 97.4 F (36.3 C)   SpO2: 96%    Body mass index is 31.61 kg/m.  Wt Readings from Last 3 Encounters:  03/08/24 167 lb 6.4 oz (75.9 kg)  12/30/22 167 lb (75.8 kg)  09/19/22 161 lb 12.8 oz (73.4 kg)   Physical Exam Vitals and nursing note reviewed.  Constitutional:      General: She is not in acute distress.    Appearance: She is well-developed.  HENT:     Head: Normocephalic and atraumatic.     Right Ear: Hearing, tympanic membrane, ear canal and external ear normal.     Left Ear: Hearing, tympanic membrane, ear canal and external ear normal.     Mouth/Throat:      Mouth: Mucous membranes are moist.     Pharynx: Oropharynx is clear. Uvula midline.  Eyes:     Extraocular Movements: Extraocular movements intact.     Conjunctiva/sclera: Conjunctivae normal.     Pupils: Pupils are equal, round, and reactive to light.  Neck:     Thyroid: No thyroid mass or thyromegaly.  Cardiovascular:     Rate and Rhythm: Normal rate and regular rhythm.     Pulses:          Dorsalis pedis pulses are 2+ on the right side and 2+ on the left side.     Heart sounds: No murmur heard. Pulmonary:     Effort: Pulmonary effort is normal. No respiratory distress.     Breath sounds: Normal breath sounds.  Abdominal:     Palpations: Abdomen is soft. There is no hepatomegaly or mass.     Tenderness: There is no abdominal tenderness.  Genitourinary:    Comments: Deferred to gyn. Musculoskeletal:     Right shoulder: No deformity or tenderness. Normal range of motion.     Right elbow: Normal range of motion. Tenderness (with abduction.) present. No radial head, medial epicondyle, lateral epicondyle or olecranon process tenderness.     Right lower leg: No edema.     Left lower leg: No edema.     Comments: No major deformity or signs of synovitis appreciated.  Lymphadenopathy:     Cervical: No cervical adenopathy.     Upper Body:     Right upper body: No supraclavicular adenopathy.     Left upper body: No supraclavicular adenopathy.  Skin:    General: Skin is warm.     Findings: No erythema or rash.  Neurological:     General: No focal deficit present.     Mental Status: She is alert and oriented to person, place, and time.     Cranial Nerves: No cranial nerve deficit.     Motor: Tremor (with intension, L>R) present.     Coordination: Coordination normal.     Gait: Gait normal.     Deep Tendon Reflexes:     Reflex Scores:      Bicep reflexes are 2+ on the right side and 2+ on the left side.      Patellar reflexes are 2+ on the right side  and 2+ on the left  side. Psychiatric:        Mood and Affect: Mood and affect normal.    ASSESSMENT AND PLAN: Ms. Linetta Regner was here today annual physical examination.  Orders Placed This Encounter  Procedures   MM 3D SCREENING MAMMOGRAM BILATERAL BREAST   Basic metabolic panel with GFR   Hemoglobin A1c   Lipid panel   Vitamin B12   Hepatitis C antibody   Ambulatory referral to Immunology   Ambulatory referral to Physical Therapy   Lab Results  Component Value Date   NA 138 03/08/2024   CL 103 03/08/2024   K 4.4 03/08/2024   CO2 29 03/08/2024   BUN 16 03/08/2024   CREATININE 0.56 03/08/2024   GFR 103.06 03/08/2024   CALCIUM 9.5 03/08/2024   ALBUMIN 4.0 09/19/2022   GLUCOSE 100 (H) 03/08/2024   Lab Results  Component Value Date   HGBA1C 6.0 03/08/2024   Lab Results  Component Value Date   VITAMINB12 820 03/08/2024   Lab Results  Component Value Date   CHOL 225 (H) 03/08/2024   HDL 57.60 03/08/2024   LDLCALC 149 (H) 03/08/2024   TRIG 93.0 03/08/2024   CHOLHDL 4 03/08/2024    Routine general medical examination at a health care facility Assessment & Plan: We discussed the importance of regular physical activity and healthy diet for prevention of chronic illness and/or complications. Preventive guidelines reviewed. Vaccination: Prefers to hold on vaccines for now. Ca++ and vit D supplementation to continue. Pap smear 12/2022 and colonoscopy 4 years ago, both negative and done in [place]. Screening mammogram order placed. Next CPE in a year.  Angioedema, sequela Based on hx and pictures, last episode 12/2023. Recommend Zyrtec 10 mg daily if she has another episode. Immunology referral placed.  -     Ambulatory referral to Immunology  Elevated blood pressure reading Assessment & Plan: No hx of HTN. BP mildly elevated today, improved after a few minutes. On Propranolol  20 mg bid for tremor. Monitor BP at home.  Orders: -     Basic metabolic panel with  GFR; Future  Hyperlipidemia, unspecified hyperlipidemia type  Continue non pharmacologic treatment. Further recommendations according to lipid panel result.  Screening for endocrine, metabolic and immunity disorder -     Basic metabolic panel with GFR; Future -     Hemoglobin A1c; Future  B12 deficiency Assessment & Plan: Continue current dose of B12 supplementation. Further recommendations according to B12 result.  Orders: -     Vitamin B12; Future  Tremor Assessment & Plan: Propranolol  20 mg bid has helped. She prefers to hold on neurology referral for now. Monitor for new symptoms. As far as problem does not get worse or new symptoms develop, we can continue annual follow ups.  Orders: -     Propranolol  HCl; Take 1 tablet (20 mg total) by mouth 2 (two) times daily.  Dispense: 180 tablet; Refill: 3  Encounter for screening for malignant neoplasm of breast, unspecified screening modality -     3D Screening Mammogram, Left and Right; Future  Right elbow pain Started a couple months ago around shoulder initially, which has resolved and now in elbow. OTC Ibuprofen helps. We discussed options, she agrees with trying PT. If persistent we can consider consultation with sport medicine.  -     Ambulatory referral to Physical Therapy  Encounter for HCV screening test for low risk patient -     Hepatitis C antibody; Future  Return  in 1 year (on 03/08/2025) for CPE.  Camyra Vaeth G. [place], MD  [facility]. [place] office.

## 2024-03-08 NOTE — Assessment & Plan Note (Signed)
 No hx of HTN. BP mildly elevated today, improved after a few minutes. On Propranolol  20 mg bid for tremor. Monitor BP at home.

## 2024-03-10 LAB — HEPATITIS C ANTIBODY: Hepatitis C Ab: NONREACTIVE

## 2024-03-29 ENCOUNTER — Ambulatory Visit
Admission: RE | Admit: 2024-03-29 | Discharge: 2024-03-29 | Disposition: A | Discharge status: Home / Self Care | Source: Ambulatory Visit | Attending: Family Medicine | Admitting: Family Medicine

## 2024-03-29 DIAGNOSIS — Z1239 Encounter for other screening for malignant neoplasm of breast: Secondary | ICD-10-CM

## 2024-03-29 DIAGNOSIS — Z1231 Encounter for screening mammogram for malignant neoplasm of breast: Secondary | ICD-10-CM | POA: Diagnosis not present

## 2024-04-01 NOTE — Therapy (Incomplete)
 OUTPATIENT PHYSICAL THERAPY UPPER EXTREMITY EVALUATION   Patient Name: Madison Moses MRN: [id number] DOB:03/07/1971, 53 y.o., female Today's Date: 04/01/2024  END OF SESSION:   No past medical history on file. Past Surgical History:  Procedure Laterality Date   BREAST EXCISIONAL BIOPSY Left 2009   Patient Active Problem List   Diagnosis Date Noted   Routine general medical examination at a health care facility 03/08/2024   Hyperlipidemia, unspecified 03/08/2024   Asthma in adult, mild intermittent, uncomplicated 12/30/2022   Tremor 09/19/2022   Elevated blood pressure reading 09/19/2022   B12 deficiency 09/19/2022   Overweight (BMI 25.0-29.9) 09/19/2022    PCP: [place], Betty G, MD  REFERRING PROVIDER: [place], Betty G, MD  REFERRING DIAG:  Diagnosis  M25.521 (ICD-10-CM) - Right elbow pain    THERAPY DIAG:  No diagnosis found.  Rationale for Evaluation and Treatment: Rehabilitation  ONSET DATE: ***  SUBJECTIVE:                                                                                                                                                                                      SUBJECTIVE STATEMENT: *** Hand dominance: {MISC; OT HAND DOMINANCE:[phone]}  PERTINENT HISTORY: ***  PAIN:  Are you having pain? Yes: NPRS scale: *** Pain location: *** Pain description: *** Aggravating factors: *** Relieving factors: ***  PRECAUTIONS: {Therapy precautions:24002}  RED FLAGS: {PT Red Flags:29287}   WEIGHT BEARING RESTRICTIONS: {Yes ***/No:24003}  FALLS:  Has patient fallen in last 6 months? {fallsyesno:27318}  LIVING ENVIRONMENT: Lives with: {[facility] lives with:25569::lives with their family} Lives in: {Lives in:25570} Stairs: {opstairs:27293} Has following equipment at home: {Assistive devices:23999}  OCCUPATION: ***  PLOF: {PLOF:24004}  PATIENT GOALS: ***  NEXT MD VISIT: ***  OBJECTIVE:  Note: Objective measures were  completed at Evaluation unless otherwise noted.  DIAGNOSTIC FINDINGS:  ***  PATIENT SURVEYS :  {rehab surveys:24030:a}  COGNITION: Overall cognitive status: {cognition:24006}     SENSATION: {sensation:27233}  POSTURE: ***  UPPER EXTREMITY ROM:   {AROM/PROM:27142} ROM Right eval Left eval  Shoulder flexion    Shoulder extension    Shoulder abduction    Shoulder adduction    Shoulder internal rotation    Shoulder external rotation    Elbow flexion    Elbow extension    Wrist flexion    Wrist extension    Wrist ulnar deviation    Wrist radial deviation    Wrist pronation    Wrist supination    (Blank rows = not tested)  UPPER EXTREMITY MMT:  MMT Right eval Left eval  Shoulder flexion    Shoulder extension    Shoulder abduction    Shoulder adduction  Shoulder internal rotation    Shoulder external rotation    Middle trapezius    Lower trapezius    Elbow flexion    Elbow extension    Wrist flexion    Wrist extension    Wrist ulnar deviation    Wrist radial deviation    Wrist pronation    Wrist supination    Grip strength (lbs)    (Blank rows = not tested)  SHOULDER SPECIAL TESTS: Impingement tests: {shoulder impingement test:25231:a} SLAP lesions: {SLAP lesions:25232} Instability tests: {shoulder instability test:25233} Rotator cuff assessment: {rotator cuff assessment:25234} Biceps assessment: {biceps assessment:25235}  JOINT MOBILITY TESTING:  ***  PALPATION:  ***                                                                                                                             TREATMENT DATE: ***   PATIENT EDUCATION: Education details: *** Person educated: {Person educated:25204} Education method: {Education Method:25205} Education comprehension: {Education Comprehension:25206}  HOME EXERCISE PROGRAM: ***  ASSESSMENT:  CLINICAL IMPRESSION: Patient is a *** y.o. *** who was seen today for physical therapy evaluation and  treatment for ***.    OBJECTIVE IMPAIRMENTS: {opptimpairments:25111}.   ACTIVITY LIMITATIONS: {activitylimitations:27494}  PARTICIPATION LIMITATIONS: {participationrestrictions:25113}  PERSONAL FACTORS: {Personal factors:25162} are also affecting patient's functional outcome.   REHAB POTENTIAL: {rehabpotential:25112}  CLINICAL DECISION MAKING: {clinical decision making:25114}  EVALUATION COMPLEXITY: {Evaluation complexity:25115}  GOALS: Goals reviewed with patient? {yes/no:20286}  SHORT TERM GOALS: Target date: ***  *** Baseline: Goal status: {GOALSTATUS:25110}  2.  *** Baseline:  Goal status: {GOALSTATUS:25110}  3.  *** Baseline:  Goal status: {GOALSTATUS:25110}  4.  *** Baseline:  Goal status: {GOALSTATUS:25110}  5.  *** Baseline:  Goal status: {GOALSTATUS:25110}  6.  *** Baseline:  Goal status: {GOALSTATUS:25110}  LONG TERM GOALS: Target date: ***  *** Baseline:  Goal status: {GOALSTATUS:25110}  2.  *** Baseline:  Goal status: {GOALSTATUS:25110}  3.  *** Baseline:  Goal status: {GOALSTATUS:25110}  4.  *** Baseline:  Goal status: {GOALSTATUS:25110}  5.  *** Baseline:  Goal status: {GOALSTATUS:25110}  6.  *** Baseline:  Goal status: {GOALSTATUS:25110}  PLAN: PT FREQUENCY: {rehab frequency:25116}  PT DURATION: {rehab duration:25117}  PLANNED INTERVENTIONS: {rehab planned interventions:25118::97110-Therapeutic exercises,97530- Therapeutic [phone]- Neuromuscular re-education,97535- Self Rjmz,02859- Manual therapy,Patient/Family education}  PLAN FOR NEXT SESSION: PIERRETTE Kristeen Sar, PT 04/01/2024, 11:43 AM

## 2024-04-04 ENCOUNTER — Ambulatory Visit: Admitting: Physical Therapy

## 2024-04-07 ENCOUNTER — Ambulatory Visit: Admitting: Allergy

## 2024-04-07 ENCOUNTER — Encounter: Payer: Self-pay | Admitting: Allergy

## 2024-04-07 ENCOUNTER — Other Ambulatory Visit: Payer: Self-pay

## 2024-04-07 VITALS — BP 118/76 | HR 74 | Temp 97.6°F | Ht 60.0 in | Wt 166.4 lb

## 2024-04-07 DIAGNOSIS — H1013 Acute atopic conjunctivitis, bilateral: Secondary | ICD-10-CM

## 2024-04-07 DIAGNOSIS — T783XXD Angioneurotic edema, subsequent encounter: Secondary | ICD-10-CM | POA: Diagnosis not present

## 2024-04-07 DIAGNOSIS — L299 Pruritus, unspecified: Secondary | ICD-10-CM

## 2024-04-07 DIAGNOSIS — T7819XD Other adverse food reactions, not elsewhere classified, subsequent encounter: Secondary | ICD-10-CM

## 2024-04-07 DIAGNOSIS — J310 Chronic rhinitis: Secondary | ICD-10-CM

## 2024-04-07 MED ORDER — LEVOCETIRIZINE DIHYDROCHLORIDE 5 MG PO TABS: 5.0000 mg | ORAL_TABLET | Freq: Every day | ORAL | 5 refills | Status: AC

## 2024-04-07 MED ORDER — FAMOTIDINE 20 MG PO TABS: ORAL_TABLET | ORAL | 5 refills | Status: AC

## 2024-04-07 NOTE — Patient Instructions (Signed)
 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 Differential includes allergic and nonallergic causes. - Order hive panel to evaluate for allergic and nonallergic causes--autoimmune and allergic workup, including liver, kidney function, blood cell counts, thyroid studies, and antibodies  - Prescribe long-acting antihistamines Xyzal 5mg  and Pepcid 20mg  take 1 tab 1-2 times a day for future episodes. - Hives can be caused by a variety of different triggers including illness/infection, foods, medications, stings, exercise, pressure, vibrations, extremes of temperature to name a few however majority of the time there is no identifiable trigger.    Seasonal allergic rhinitis Symptoms consistent with seasonal allergic rhinitis. - Recommend Xyzal 5mg  daily as needed for symptom management. - Order environmental allergy panel to identify specific allergens.  Food reaction - continue avoidance of pineapple and will check for allergy  Follow-up in 3-4 months or sooner if needed

## 2024-04-07 NOTE — Progress Notes (Signed)
 New Patient Note  RE: Madison Moses MRN: [id number] DOB: 1970-07-09 Date of Office Visit: 04/07/2024  Primary care provider: [place], Betty G, MD  Chief Complaint: swelling and seasonal allergies  History of present illness: Madison Moses is a 53 y.o. female presenting today for evaluation of angioedema.  Discussed the use of AI scribe software for clinical note transcription with the patient, who gave verbal consent to proceed.  She has experienced three episodes of facial swelling and redness this year, occurring between May and August. Each episode lasted about a week, with the first episode lasting three days. Symptoms include redness, itching, and swelling, primarily affecting the face and neck, without any swelling of the tongue or symptoms below the neck.  She has used both oral and topical Benadryl during these episodes, which provided relief for itching but not for swelling. There were no residual marks or bruising after the symptoms resolved, although she noted a dark mark that disappeared after a week during one episode.  No fever, joint aches, or pain during these episodes. There is no history of recent illness or vaccinations prior to the onset of symptoms. She has not experienced similar symptoms before this year.  She recalls a significant wasp sting incident in May 2024, where she was stung multiple times on her face and body, resulting in itching but no breathing difficulties or other systemic symptoms. She managed the reaction with Benadryl.  She recently reintroduced red meat into her diet in December of the previous year after an eight-year hiatus, consuming it about once a week. She also reports having seasonal allergies, with symptoms including runny nose and occasional chest congestion.  At this time not taking anything for the symptoms. She does avoid pineapple as she states she is having an allergic reaction to pineapple.    Review of systems: 10pt ROS  negative unless noted above in HPI  Past medical history: Past Medical History:  Diagnosis Date   B12 deficiency    Essential tremor    Hyperlipidemia     Past surgical history: Past Surgical History:  Procedure Laterality Date   BREAST EXCISIONAL BIOPSY Left 2009    Family history:  Family History  Problem Relation Age of Onset   Hypertension Mother    Hyperlipidemia Mother    Diabetes Mother    Healthy Mother    Alzheimer's disease Mother    Heart disease Father    Healthy Father    Heart attack Father    Healthy Sister    Healthy Brother    Healthy Daughter     Social history: Lives in a home without carpeting with electric heating and central cooling.  Dog in the home.  No concern for water damage, mildew or roaches in the home.  She is a [employer] architect.  Denies a smoking history.   Medication List: Current Outpatient Medications  Medication Sig Dispense Refill   Cholecalciferol (VITAMIN D3) 125 MCG (5000 UT) capsule Take 5,000 Units by mouth daily.     MAGNESIUM PO Take 325 mcg by mouth.     propranolol  (INDERAL ) 20 MG tablet Take 1 tablet (20 mg total) by mouth 2 (two) times daily. 180 tablet 3   Cyanocobalamin 5000 MCG CAPS Take by mouth.     No current facility-administered medications for this visit.    Known medication allergies: No Known Allergies   Physical examination: Blood pressure 118/76, pulse 74, temperature 97.6 F (36.4 C), temperature source Temporal, height 5' (1.524 m), weight  166 lb 6.4 oz (75.5 kg), last menstrual period 01/26/2024, SpO2 98%.  General: Alert, interactive, in no acute distress. HEENT: PERRLA, TMs pearly gray, turbinates non-edematous without discharge, post-pharynx non erythematous. Neck: Supple without lymphadenopathy. Lungs: Clear to auscultation without wheezing, rhonchi or rales. {no increased work of breathing. CV: Normal S1, S2 without murmurs. Abdomen: Nondistended, nontender. Skin: Warm and dry, without  lesions or rashes. Extremities:  No clubbing, cyanosis or edema. Neuro:   Grossly intact.  Diagnostics/Labs: None today  Assessment and plan:   Chronic recurrent urticaria (redness) with facial swelling Recurrent urticaria with facial swelling. Differential includes allergic and nonallergic causes. - Order hive panel to evaluate for allergic and nonallergic causes--autoimmune and allergic workup, including liver, kidney function, blood cell counts, thyroid studies, and antibodies  - Prescribe long-acting antihistamines Xyzal 5mg  and Pepcid 20mg  take 1 tab 1-2 times a day for future episodes. - Hives can be caused by a variety of different triggers including illness/infection, foods, medications, stings, exercise, pressure, vibrations, extremes of temperature to name a few however majority of the time there is no identifiable trigger.   Seasonal allergic rhinitis Symptoms consistent with seasonal allergic rhinitis. - Recommend Xyzal 5mg  daily as needed for symptom management. - Order environmental allergy panel to identify specific allergens.  Food reaction - continue avoidance of pineapple and will check for allergy  Follow-up in 3-4 months or sooner if needed  I appreciate the opportunity to take part in Madison Moses's care. Please do not hesitate to contact me with questions.  Sincerely,   Danita Brain, MD Allergy/Immunology [facility]

## 2024-04-14 LAB — ALLERGENS W/TOTAL IGE AREA 2
Alternaria Alternata IgE: <0.1 kU/L
Aspergillus Fumigatus IgE: <0.1 kU/L
Bermuda Grass IgE: 0.14 kU/L — AB
Cat Dander IgE: <0.1 kU/L
Cedar, Mountain IgE: <0.1 kU/L
Cladosporium Herbarum IgE: <0.1 kU/L
Cockroach, German IgE: <0.1 kU/L
Common Silver Birch IgE: <0.1 kU/L
Cottonwood IgE: 0.12 kU/L — AB
D Farinae IgE: <0.1 kU/L
D Pteronyssinus IgE: <0.1 kU/L
Dog Dander IgE: <0.1 kU/L
Elm, American IgE: 0.13 kU/L — AB
Johnson Grass IgE: <0.1 kU/L
Maple/Box Elder IgE: 0.14 kU/L — AB
Mouse Urine IgE: <0.1 kU/L
Oak, White IgE: <0.1 kU/L
Pecan, Hickory IgE: 0.13 kU/L — AB
Penicillium Chrysogen IgE: <0.1 kU/L
Pigweed, Rough IgE: <0.1 kU/L
Ragweed, Short IgE: 0.14 kU/L — AB
Sheep Sorrel IgE Qn: 0.13 kU/L — AB
Timothy Grass IgE: 0.4 kU/L — AB
White Mulberry IgE: <0.1 kU/L

## 2024-04-14 LAB — ALPHA-GAL PANEL
Allergen Lamb IgE: <0.1 kU/L
Beef IgE: <0.1 kU/L
IgE (Immunoglobulin E), Serum: 51 [IU]/mL (ref 6–495)
O215-IgE Alpha-Gal: <0.1 kU/L
Pork IgE: <0.1 kU/L

## 2024-04-14 LAB — CHRONIC URTICARIA (CU) EVAL
ALT: 19 IU/L (ref 0–32)
Basophils Absolute: 0.1 x10E3/uL (ref 0.0–0.2)
Basos: 1 %
CRP: 1 mg/L (ref 0–10)
EOS (ABSOLUTE): 0.2 x10E3/uL (ref 0.0–0.4)
Eos: 4 %
Hematocrit: 40 % (ref 34.0–46.6)
Hemoglobin: 12.6 g/dL (ref 11.1–15.9)
Immature Grans (Abs): 0 x10E3/uL (ref 0.0–0.1)
Immature Granulocytes: 0 %
Lymphocytes Absolute: 1.8 x10E3/uL (ref 0.7–3.1)
Lymphs: 30 %
MCH: 26.5 pg — ABNORMAL LOW (ref 26.6–33.0)
MCHC: 31.5 g/dL (ref 31.5–35.7)
MCV: 84 fL (ref 79–97)
Monocytes Absolute: 0.5 x10E3/uL (ref 0.1–0.9)
Monocytes: 8 %
Neutrophils Absolute: 3.6 x10E3/uL (ref 1.4–7.0)
Neutrophils: 57 %
Platelets: 366 x10E3/uL (ref 150–450)
Pooled Donor- BAT CU: 5.65 % (ref 0.00–10.60)
RBC: 4.75 x10E6/uL (ref 3.77–5.28)
RDW: 13.1 % (ref 11.7–15.4)
Sed Rate: 27 mm/h (ref 0–40)
TSH: 2.82 u[IU]/mL (ref 0.450–4.500)
Thyroperoxidase Ab SerPl-aCnc: 20 [IU]/mL (ref 0–34)
WBC: 6.2 x10E3/uL (ref 3.4–10.8)

## 2024-04-14 LAB — TRYPTASE: Tryptase: 3.7 ug/L (ref 2.2–13.2)

## 2024-04-14 LAB — ALLERGEN, PINEAPPLE, F210: Pineapple IgE: 0.17 kU/L — AB

## 2024-04-19 ENCOUNTER — Ambulatory Visit: Payer: Self-pay | Admitting: Allergy

## 2024-06-29 ENCOUNTER — Ambulatory Visit (HOSPITAL_BASED_OUTPATIENT_CLINIC_OR_DEPARTMENT_OTHER): Attending: Family Medicine | Admitting: Physical Therapy

## 2024-06-29 ENCOUNTER — Encounter (HOSPITAL_BASED_OUTPATIENT_CLINIC_OR_DEPARTMENT_OTHER): Payer: Self-pay | Admitting: Physical Therapy

## 2024-06-29 ENCOUNTER — Other Ambulatory Visit: Payer: Self-pay

## 2024-06-29 DIAGNOSIS — R29898 Other symptoms and signs involving the musculoskeletal system: Secondary | ICD-10-CM | POA: Diagnosis not present

## 2024-06-29 DIAGNOSIS — M6281 Muscle weakness (generalized): Secondary | ICD-10-CM | POA: Diagnosis not present

## 2024-06-29 DIAGNOSIS — M25521 Pain in right elbow: Secondary | ICD-10-CM | POA: Diagnosis present

## 2024-06-29 NOTE — Therapy (Signed)
 " OUTPATIENT PHYSICAL THERAPY SHOULDER/ELBOW EVALUATION   Patient Name: Madison Moses MRN: [id number] DOB:Nov 29, 1970, 54 y.o., female Today's Date: 06/29/2024  END OF SESSION:  PT End of Session - 06/29/24 1021     Visit Number 1    Number of Visits 16    Date for Recertification  08/24/24    Authorization Type [place] MEDICAID [employer]    PT Start Time 1022    PT Stop Time 1052    PT Time Calculation (min) 30 min    Activity Tolerance Patient tolerated treatment well    Behavior During Therapy WFL for tasks assessed/performed          Past Medical History:  Diagnosis Date   B12 deficiency    Essential tremor    Hyperlipidemia    Past Surgical History:  Procedure Laterality Date   BREAST EXCISIONAL BIOPSY Left 2009   Patient Active Problem List   Diagnosis Date Noted   Routine general medical examination at a health care facility 03/08/2024   Hyperlipidemia, unspecified 03/08/2024   Asthma in adult, mild intermittent, uncomplicated 12/30/2022   Tremor 09/19/2022   Elevated blood pressure reading 09/19/2022   B12 deficiency 09/19/2022   Overweight (BMI 25.0-29.9) 09/19/2022    PCP: Jordan, Betty G, MD  REFERRING PROVIDER: Jordan, Betty G, MD  REFERRING DIAG: 25.521 (ICD-10-CM) - Right elbow pain  THERAPY DIAG:  Pain in right elbow  Muscle weakness (generalized)  Other symptoms and signs involving the musculoskeletal system  Rationale for Evaluation and Treatment: Rehabilitation  ONSET DATE: 12/2023  SUBJECTIVE:                                                                                                                                                                                      SUBJECTIVE STATEMENT: Patient states pain with lifting with her R arm in elbow region. She thinks it may have started with lifting carry on bag. Symptoms are about the same as onset but they wax and wane. She works in [employer] and it has pain with  work tasks. Member at [employer].   Hand dominance: Right  PERTINENT HISTORY: Tremor   PAIN:  Are you having pain? Yes: NPRS scale: 5/10 Pain location: R elbow Pain description: sharp Aggravating factors: gripping/lifting Relieving factors: rest  PRECAUTIONS: None   WEIGHT BEARING RESTRICTIONS: No  FALLS:  Has patient fallen in last 6 months? No  OCCUPATION: Naval architect  PLOF: Independent  PATIENT GOALS:feel better   OBJECTIVE: (objective measures from initial evaluation unless otherwise dated)  PATIENT SURVEYS:  UEFS  Extreme difficulty/unable (0), Quite a bit of difficulty (1), Moderate difficulty (2), Little difficulty (3), No  difficulty (4) Survey date:  06/29/24  Any of your usual work, household or school activities 2  2. Your usual hobbies, recreational/sport activities 2   3. Lifting a bag of groceries to waist level 3   4. Lifting a bag of groceries above your head 3  5. Grooming your hair 1  6. Pushing up on your hands (I.e. from bathtub or chair) 2  7. Preparing food (I.e. peeling/cutting) 1  8. Driving  1  9. Vacuuming, sweeping, or raking 0  10. Dressing  0  11. Doing up buttons 0  12. Using tools/appliances 2  13. Opening doors 1  14. Cleaning  1  15. Tying or lacing shoes 0  16. Sleeping  1  17. Laundering clothes (I.e. washing, ironing, folding) 1  18. Opening a jar 3  19. Throwing a ball 2  20. Carrying a small suitcase with your affected limb.  3  Score total:  29/80     COGNITION: Overall cognitive status: Within functional limits for tasks assessed     SENSATION: WFL  POSTURE: No Significant postural limitations   UPPER EXTREMITY ROM: WFL with tasks assessed with UE screening.   Active ROM Right eval Left eval  Shoulder flexion    Shoulder extension    Shoulder abduction    Shoulder adduction    Shoulder internal rotation    Shoulder external rotation    Elbow flexion    Elbow extension    Wrist flexion    Wrist  extension    Wrist ulnar deviation    Wrist radial deviation    Wrist pronation    Wrist supination    (Blank rows = not tested) *=pain/symptoms  UPPER EXTREMITY MMT:  MMT Right eval Left eval  Shoulder flexion 5 5  Shoulder extension    Shoulder abduction 5 5  Shoulder adduction    Shoulder internal rotation 5 5  Shoulder external rotation 5 5  Middle trapezius    Lower trapezius    Elbow flexion 4+ 5  Elbow extension 4+ 5  Wrist flexion 5 5  Wrist extension 4+* 5  Wrist ulnar deviation    Wrist radial deviation    Wrist pronation    Wrist supination    Grip strength (lbs) 38# 41#  (Blank rows = not tested) *=pain/symptoms    PALPATION:  TTP R distal bicep, wrist extensors, brachioradialis    TODAY'S TREATMENT:                                                                                                                                         DATE:  06/29/24 Eval, education and HEP  PATIENT EDUCATION:  Education details: Patient educated on exam findings, POC, scope of PT, HEP, relevant anatomy and biomechanics. Person educated: Patient Education method: Explanation, Demonstration, and Handouts Education comprehension: verbalized understanding, returned demonstration, verbal cues required, and tactile cues required  HOME EXERCISE PROGRAM: Access Code: [email or web address] URL: [email or web address] Date: 06/29/2024 Prepared by: Prentice Chrisangel Eskenazi  Exercises - Seated Isometric Elbow Flexion  - 2 x daily - 7 x weekly - 10 reps - 10 second hold - Isometric Wrist Extension Pronated  - 2 x daily - 7 x weekly - 10 reps - 10 second hold  ASSESSMENT:  CLINICAL IMPRESSION: Patient a 54 y.o. y.o. female who was seen today for physical therapy evaluation and treatment for R elbow pain. Patient presents with pain limited deficits in R elbow strength, ROM, endurance, activity tolerance, and functional mobility with ADL. Patient is having to modify and restrict ADL as  indicated by outcome measure score as well as subjective information and objective measures which is affecting overall participation. Patient will benefit from skilled physical therapy in order to improve function and reduce impairment.  OBJECTIVE IMPAIRMENTS: decreased activity tolerance, decreased endurance, decreased mobility, decreased ROM, decreased strength, increased muscle spasms, impaired flexibility, impaired UE functional use, improper body mechanics, and pain  ACTIVITY LIMITATIONS:  carrying, lifting, bending, sleeping, bed mobility, reach over head, hygiene/grooming, and caring for others  PARTICIPATION LIMITATIONS:  meal prep, cleaning, laundry, driving, shopping, community activity, occupation, and yard work  PERSONAL FACTORS: Time since onset of injury/illness/exacerbation are also affecting patient's functional outcome.   REHAB POTENTIAL: Good  CLINICAL DECISION MAKING: Stable/uncomplicated  EVALUATION COMPLEXITY: Low   GOALS: Goals reviewed with patient? Yes  SHORT TERM GOALS: Target date: 07/27/2024    Patient will be independent with HEP in order to improve functional outcomes. Baseline: Goal status: INITIAL  2.  Patient will report at least 25% improvement in symptoms for improved quality of life. Baseline:  Goal status: INITIAL    LONG TERM GOALS: Target date: 08/24/2024    Patient will report at least 75% improvement in symptoms for improved quality of life. Baseline:  Goal status: INITIAL  2.  Patient will improve UEFS score by at least 18 points in order to indicate improved tolerance to activity. Baseline:  Goal status: INITIAL  3.  Patient will have reduction in trigger points in R elbow for decrease in symptoms.. Baseline:  Goal status: INITIAL  4.  Patient will be able to return to all activities unrestricted for improved ability to perform work functions and participate with family.  Baseline:  Goal status: INITIAL  5.  Patient will  demonstrate grade of 5/5 MMT grade in all tested musculature as evidence of improved strength to assist with lifting at home. Baseline:  Goal status: INITIAL    PLAN:  PT FREQUENCY: 1-2x/week  PT DURATION: 8 weeks  PLANNED INTERVENTIONS:97164- PT Re-evaluation, 97110-Therapeutic exercises, 97530- Therapeutic activity, [id number]- Neuromuscular re-education, 97535- Self Care, 02859- Manual therapy, [phone]- Gait training, [phone]- Orthotic Fit/training, [phone]- Canalith repositioning, [id number]- Aquatic Therapy, [phone]- Splinting, [phone]- Wound care (first 20 sq cm), 97598- Wound care (each additional 20 sq cm)Patient/Family education, Balance training, Stair training, Taping, Dry Needling, Joint mobilization, Joint manipulation, Spinal manipulation, Spinal mobilization, Scar mobilization, and DME instructions.  PLAN FOR NEXT SESSION: possibly manual for pain/tissue relaxation, elbow and wrist extension strength    Prentice GORMAN Stains, PT, DPT 06/29/2024, 10:53 AM   For all possible CPT codes, reference the Planned Interventions line above.     Check all conditions that are expected to impact treatment: {Conditions expected to impact treatment:None of these apply   If treatment provided at initial evaluation, no treatment charged due to lack of authorization.      "

## 2024-07-06 ENCOUNTER — Ambulatory Visit (HOSPITAL_BASED_OUTPATIENT_CLINIC_OR_DEPARTMENT_OTHER): Admitting: Physical Therapy

## 2024-07-06 ENCOUNTER — Encounter (HOSPITAL_BASED_OUTPATIENT_CLINIC_OR_DEPARTMENT_OTHER): Payer: Self-pay | Admitting: Physical Therapy

## 2024-07-06 DIAGNOSIS — M25521 Pain in right elbow: Secondary | ICD-10-CM | POA: Diagnosis not present

## 2024-07-06 DIAGNOSIS — R29898 Other symptoms and signs involving the musculoskeletal system: Secondary | ICD-10-CM

## 2024-07-06 DIAGNOSIS — M6281 Muscle weakness (generalized): Secondary | ICD-10-CM

## 2024-07-06 NOTE — Therapy (Signed)
 " OUTPATIENT PHYSICAL THERAPY TREATMENT   Patient Name: Madison Moses MRN: [id number] DOB:16-Jan-1971, 54 y.o., female Today's Date: 07/06/2024  END OF SESSION:  PT End of Session - 07/06/24 1017     Visit Number 2    Number of Visits 16    Date for Recertification  08/24/24    Authorization Type [place] MEDICAID [employer]UNITY    Authorization Time Period 32 visits approved 06/29/24-08/24/24    Authorization - Visit Number 2    Authorization - Number of Visits 32    PT Start Time 1018    PT Stop Time 1056    PT Time Calculation (min) 38 min    Activity Tolerance Patient tolerated treatment well    Behavior During Therapy WFL for tasks assessed/performed          Past Medical History:  Diagnosis Date   B12 deficiency    Essential tremor    Hyperlipidemia    Past Surgical History:  Procedure Laterality Date   BREAST EXCISIONAL BIOPSY Left 2009   Patient Active Problem List   Diagnosis Date Noted   Routine general medical examination at a health care facility 03/08/2024   Hyperlipidemia, unspecified 03/08/2024   Asthma in adult, mild intermittent, uncomplicated 12/30/2022   Tremor 09/19/2022   Elevated blood pressure reading 09/19/2022   B12 deficiency 09/19/2022   Overweight (BMI 25.0-29.9) 09/19/2022    PCP: Jordan, Betty G, MD  REFERRING PROVIDER: Jordan, Betty G, MD  REFERRING DIAG: 25.521 (ICD-10-CM) - Right elbow pain  THERAPY DIAG:  Pain in right elbow  Muscle weakness (generalized)  Other symptoms and signs involving the musculoskeletal system  Rationale for Evaluation and Treatment: Rehabilitation  ONSET DATE: 12/2023  SUBJECTIVE:                                                                                                                                                                                      SUBJECTIVE STATEMENT: Patient states HEP is alright. A little sore from new exercise. Symptoms are a little bit better when  gripping.   EVAL: Patient states pain with lifting with her R arm in elbow region. She thinks it may have started with lifting carry on bag. Symptoms are about the same as onset but they wax and wane. She works in [employer] and it has pain with work tasks. Member at [employer].   Hand dominance: Right  PERTINENT HISTORY: Tremor   PAIN:  Are you having pain? Yes: NPRS scale: 5/10 Pain location: R elbow Pain description: sharp Aggravating factors: gripping/lifting Relieving factors: rest  PRECAUTIONS: None   WEIGHT BEARING RESTRICTIONS: No  FALLS:  Has patient fallen in  last 6 months? No  OCCUPATION: Naval architect  PLOF: Independent  PATIENT GOALS:feel better   OBJECTIVE: (objective measures from initial evaluation unless otherwise dated)  PATIENT SURVEYS:  UEFS  Extreme difficulty/unable (0), Quite a bit of difficulty (1), Moderate difficulty (2), Little difficulty (3), No difficulty (4) Survey date:  06/29/24  Any of your usual work, household or school activities 2  2. Your usual hobbies, recreational/sport activities 2   3. Lifting a bag of groceries to waist level 3   4. Lifting a bag of groceries above your head 3  5. Grooming your hair 1  6. Pushing up on your hands (I.e. from bathtub or chair) 2  7. Preparing food (I.e. peeling/cutting) 1  8. Driving  1  9. Vacuuming, sweeping, or raking 0  10. Dressing  0  11. Doing up buttons 0  12. Using tools/appliances 2  13. Opening doors 1  14. Cleaning  1  15. Tying or lacing shoes 0  16. Sleeping  1  17. Laundering clothes (I.e. washing, ironing, folding) 1  18. Opening a jar 3  19. Throwing a ball 2  20. Carrying a small suitcase with your affected limb.  3  Score total:  29/80     COGNITION: Overall cognitive status: Within functional limits for tasks assessed     SENSATION: WFL  POSTURE: No Significant postural limitations   UPPER EXTREMITY ROM: WFL with tasks assessed with UE screening.    Active ROM Right eval Left eval  Shoulder flexion    Shoulder extension    Shoulder abduction    Shoulder adduction    Shoulder internal rotation    Shoulder external rotation    Elbow flexion    Elbow extension    Wrist flexion    Wrist extension    Wrist ulnar deviation    Wrist radial deviation    Wrist pronation    Wrist supination    (Blank rows = not tested) *=pain/symptoms  UPPER EXTREMITY MMT:  MMT Right eval Left eval  Shoulder flexion 5 5  Shoulder extension    Shoulder abduction 5 5  Shoulder adduction    Shoulder internal rotation 5 5  Shoulder external rotation 5 5  Middle trapezius    Lower trapezius    Elbow flexion 4+ 5  Elbow extension 4+ 5  Wrist flexion 5 5  Wrist extension 4+* 5  Wrist ulnar deviation    Wrist radial deviation    Wrist pronation    Wrist supination    Grip strength (lbs) 38# 41#  (Blank rows = not tested) *=pain/symptoms    PALPATION:  TTP R distal bicep, wrist extensors, brachioradialis    TODAY'S TREATMENT:                                                                                                                                         DATE:  07/06/24 Wrist flexion and extension stretches 3 x 20 second holds each Seated elbow flexion isometric 5 x 10 second holds Wrist extension isometric 5 x 10 second holds Seated wrist extension GTB 2 x 10 Standing row GTB 3 x 10 Standing shoulder extension GTB 3 x 10  Standing bicep curl 3# 3 x 10 Standing hammer curl 3# 3 x 10 Standing pronated 3# 3 x 10   06/29/24 Eval, education and HEP  PATIENT EDUCATION:  Education details: Patient educated on exam findings, POC, scope of PT, HEP, relevant anatomy and biomechanics.07/06/24 HEP Person educated: Patient Education method: Programmer, Multimedia, Demonstration, and Handouts Education comprehension: verbalized understanding, returned demonstration, verbal cues required, and tactile cues required  HOME EXERCISE PROGRAM: Access  Code: [email or web address] URL: [email or web address] Date: 06/29/2024 Prepared by: Prentice Nathanyal Ashmead  Exercises - Seated Isometric Elbow Flexion  - 2 x daily - 7 x weekly - 10 reps - 10 second hold - Isometric Wrist Extension Pronated  - 2 x daily - 7 x weekly - 10 reps - 10 second hold  ASSESSMENT:  CLINICAL IMPRESSION: Review of previously completed exercises and patient performs well. Began additional mobility and strengthening exercises with intermittent cueing to avoid compensation. Educated on probable soreness following session. Patient will continue to benefit from physical therapy in order to improve function and reduce impairment.   OBJECTIVE IMPAIRMENTS: decreased activity tolerance, decreased endurance, decreased mobility, decreased ROM, decreased strength, increased muscle spasms, impaired flexibility, impaired UE functional use, improper body mechanics, and pain  ACTIVITY LIMITATIONS:  carrying, lifting, bending, sleeping, bed mobility, reach over head, hygiene/grooming, and caring for others  PARTICIPATION LIMITATIONS:  meal prep, cleaning, laundry, driving, shopping, community activity, occupation, and yard work  PERSONAL FACTORS: Time since onset of injury/illness/exacerbation are also affecting patient's functional outcome.   REHAB POTENTIAL: Good  CLINICAL DECISION MAKING: Stable/uncomplicated  EVALUATION COMPLEXITY: Low   GOALS: Goals reviewed with patient? Yes  SHORT TERM GOALS: Target date: 07/27/2024    Patient will be independent with HEP in order to improve functional outcomes. Baseline: Goal status: INITIAL  2.  Patient will report at least 25% improvement in symptoms for improved quality of life. Baseline:  Goal status: INITIAL    LONG TERM GOALS: Target date: 08/24/2024    Patient will report at least 75% improvement in symptoms for improved quality of life. Baseline:  Goal status: INITIAL  2.  Patient will improve UEFS score by at  least 18 points in order to indicate improved tolerance to activity. Baseline:  Goal status: INITIAL  3.  Patient will have reduction in trigger points in R elbow for decrease in symptoms.. Baseline:  Goal status: INITIAL  4.  Patient will be able to return to all activities unrestricted for improved ability to perform work functions and participate with family.  Baseline:  Goal status: INITIAL  5.  Patient will demonstrate grade of 5/5 MMT grade in all tested musculature as evidence of improved strength to assist with lifting at home. Baseline:  Goal status: INITIAL    PLAN:  PT FREQUENCY: 1-2x/week  PT DURATION: 8 weeks  PLANNED INTERVENTIONS:[id number]- PT Re-evaluation, 97110-Therapeutic exercises, 97530- Therapeutic activity, [id number]- Neuromuscular re-education, 97535- Self Care, 02859- Manual therapy, [phone]- Gait training, [phone]- Orthotic Fit/training, [phone]- Canalith repositioning, [id number]- Aquatic Therapy, [phone]- Splinting, [phone]- Wound care (first 20 sq cm), 97598- Wound care (each additional 20 sq cm)Patient/Family education, Balance training, Stair training, Taping, Dry Needling, Joint mobilization, Joint manipulation, Spinal manipulation, Spinal mobilization, Scar mobilization, and DME instructions.  PLAN  FOR NEXT SESSION: possibly manual for pain/tissue relaxation, elbow and wrist extension strength    Prentice RAMAN Bari Handshoe, PT, DPT 07/06/2024, 10:57 AM   For all possible CPT codes, reference the Planned Interventions line above.     Check all conditions that are expected to impact treatment: {Conditions expected to impact treatment:None of these apply   If treatment provided at initial evaluation, no treatment charged due to lack of authorization.      "

## 2024-07-13 ENCOUNTER — Encounter (HOSPITAL_BASED_OUTPATIENT_CLINIC_OR_DEPARTMENT_OTHER): Payer: Self-pay | Admitting: Physical Therapy

## 2024-07-13 ENCOUNTER — Ambulatory Visit (HOSPITAL_BASED_OUTPATIENT_CLINIC_OR_DEPARTMENT_OTHER): Admitting: Physical Therapy

## 2024-07-13 DIAGNOSIS — R29898 Other symptoms and signs involving the musculoskeletal system: Secondary | ICD-10-CM

## 2024-07-13 DIAGNOSIS — M25521 Pain in right elbow: Secondary | ICD-10-CM | POA: Diagnosis not present

## 2024-07-13 DIAGNOSIS — M6281 Muscle weakness (generalized): Secondary | ICD-10-CM

## 2024-07-13 NOTE — Therapy (Signed)
 " OUTPATIENT PHYSICAL THERAPY TREATMENT   Patient Name: Madison Moses MRN: [id number] DOB:1970/09/08, 54 y.o., female Today's Date: 07/13/2024  END OF SESSION:  PT End of Session - 07/13/24 1020     Visit Number 3    Number of Visits 16    Date for Recertification  08/24/24    Authorization Type  [employer]ICA[employer]    Authorization Time Period 16 visits approved 06/29/24-08/24/24    Authorization - Visit Number 3    Authorization - Number of Visits 16    PT Start Time 1019    PT Stop Time 1059    PT Time Calculation (min) 40 min    Activity Tolerance Patient tolerated treatment well    Behavior During Therapy [facility] for tasks assessed/performed          Past Medical History:  Diagnosis Date   B12 deficiency    Essential tremor    Hyperlipidemia    Past Surgical History:  Procedure Laterality Date   BREAST EXCISIONAL BIOPSY Left 2009   Patient Active Problem List   Diagnosis Date Noted   Routine general medical examination at a health care facility 03/08/2024   Hyperlipidemia, unspecified 03/08/2024   Asthma in adult, mild intermittent, uncomplicated 12/30/2022   Tremor 09/19/2022   Elevated blood pressure reading 09/19/2022   B12 deficiency 09/19/2022   Overweight (BMI 25.0-29.9) 09/19/2022    PCP: Jordan, Betty G, MD  REFERRING PROVIDER: Jordan, Betty G, MD  REFERRING DIAG: 25.521 (ICD-10-CM) - Right elbow pain  THERAPY DIAG:  Pain in right elbow  Muscle weakness (generalized)  Other symptoms and signs involving the musculoskeletal system  Rationale for Evaluation and Treatment: Rehabilitation  ONSET DATE: 12/2023  SUBJECTIVE:                                                                                                                                                                                      SUBJECTIVE STATEMENT: Patient states still better but more sore today due to having to shovel the ice.   EVAL: Patient states  pain with lifting with her R arm in elbow region. She thinks it may have started with lifting carry on bag. Symptoms are about the same as onset but they wax and wane. She works in [employer] and it has pain with work tasks. Member at [employer].   Hand dominance: Right  PERTINENT HISTORY: Tremor   PAIN:  Are you having pain? Yes: NPRS scale: 5/10 Pain location: R elbow Pain description: sharp Aggravating factors: gripping/lifting Relieving factors: rest  PRECAUTIONS: None   WEIGHT BEARING RESTRICTIONS: No  FALLS:  Has patient fallen in last 6 months? No  OCCUPATION: Naval architect  PLOF: Independent  PATIENT GOALS:feel better   OBJECTIVE: (objective measures from initial evaluation unless otherwise dated)  PATIENT SURVEYS:  UEFS  Extreme difficulty/unable (0), Quite a bit of difficulty (1), Moderate difficulty (2), Little difficulty (3), No difficulty (4) Survey date:  06/29/24  Any of your usual work, household or school activities 2  2. Your usual hobbies, recreational/sport activities 2   3. Lifting a bag of groceries to waist level 3   4. Lifting a bag of groceries above your head 3  5. Grooming your hair 1  6. Pushing up on your hands (I.e. from bathtub or chair) 2  7. Preparing food (I.e. peeling/cutting) 1  8. Driving  1  9. Vacuuming, sweeping, or raking 0  10. Dressing  0  11. Doing up buttons 0  12. Using tools/appliances 2  13. Opening doors 1  14. Cleaning  1  15. Tying or lacing shoes 0  16. Sleeping  1  17. Laundering clothes (I.e. washing, ironing, folding) 1  18. Opening a jar 3  19. Throwing a ball 2  20. Carrying a small suitcase with your affected limb.  3  Score total:  29/80     COGNITION: Overall cognitive status: Within functional limits for tasks assessed     SENSATION: WFL  POSTURE: No Significant postural limitations   UPPER EXTREMITY ROM: WFL with tasks assessed with UE screening.   Active ROM Right eval Left eval   Shoulder flexion    Shoulder extension    Shoulder abduction    Shoulder adduction    Shoulder internal rotation    Shoulder external rotation    Elbow flexion    Elbow extension    Wrist flexion    Wrist extension    Wrist ulnar deviation    Wrist radial deviation    Wrist pronation    Wrist supination    (Blank rows = not tested) *=pain/symptoms  UPPER EXTREMITY MMT:  MMT Right eval Left eval  Shoulder flexion 5 5  Shoulder extension    Shoulder abduction 5 5  Shoulder adduction    Shoulder internal rotation 5 5  Shoulder external rotation 5 5  Middle trapezius    Lower trapezius    Elbow flexion 4+ 5  Elbow extension 4+ 5  Wrist flexion 5 5  Wrist extension 4+* 5  Wrist ulnar deviation    Wrist radial deviation    Wrist pronation    Wrist supination    Grip strength (lbs) 38# 41#  (Blank rows = not tested) *=pain/symptoms    PALPATION:  TTP R distal bicep, wrist extensors, brachioradialis    TODAY'S TREATMENT:                                                                                                                                         DATE:  07/13/24 Seated elbow flexion  isometric 5 x 10 second holds Wrist extension isometric 5 x 10 second holds Wrist extension 4# 3 x 10 Standing bicep curl 4# 3 x 10 Standing hammer curl 4# 3 x 10 Standing pronated 4# 3 x 10 Shoulder bilateral ER GTB 3 x 10 Shoulder bilateral horizontal abduction GTB 3 x 10  Shoulder PNF D2 GTB 3 x 10 Supination 4# 3 x 10   07/06/24 Wrist flexion and extension stretches 3 x 20 second holds each Seated elbow flexion isometric 5 x 10 second holds Wrist extension isometric 5 x 10 second holds Seated wrist extension GTB 2 x 10 Standing row GTB 3 x 10 Standing shoulder extension GTB 3 x 10  Standing bicep curl 3# 3 x 10 Standing hammer curl 3# 3 x 10 Standing pronated 3# 3 x 10   06/29/24 Eval, education and HEP  PATIENT EDUCATION:  Education details: Patient educated  on exam findings, POC, scope of PT, HEP, relevant anatomy and biomechanics.07/06/24 HEP Person educated: Patient Education method: Programmer, Multimedia, Demonstration, and Handouts Education comprehension: verbalized understanding, returned demonstration, verbal cues required, and tactile cues required  HOME EXERCISE PROGRAM: Access Code: [email or web address] URL: [email or web address] Date: 06/29/2024 Prepared by: Prentice Abryana Lykens  Exercises - Seated Isometric Elbow Flexion  - 2 x daily - 7 x weekly - 10 reps - 10 second hold - Isometric Wrist Extension Pronated  - 2 x daily - 7 x weekly - 10 reps - 10 second hold  ASSESSMENT:  CLINICAL IMPRESSION: Patient continues to tolerated RUE strengthening well. No c/o pain throughout session. Overall, continues to progress well.  Patient will continue to benefit from physical therapy in order to improve function and reduce impairment.   OBJECTIVE IMPAIRMENTS: decreased activity tolerance, decreased endurance, decreased mobility, decreased ROM, decreased strength, increased muscle spasms, impaired flexibility, impaired UE functional use, improper body mechanics, and pain  ACTIVITY LIMITATIONS:  carrying, lifting, bending, sleeping, bed mobility, reach over head, hygiene/grooming, and caring for others  PARTICIPATION LIMITATIONS:  meal prep, cleaning, laundry, driving, shopping, community activity, occupation, and yard work  PERSONAL FACTORS: Time since onset of injury/illness/exacerbation are also affecting patient's functional outcome.   REHAB POTENTIAL: Good  CLINICAL DECISION MAKING: Stable/uncomplicated  EVALUATION COMPLEXITY: Low   GOALS: Goals reviewed with patient? Yes  SHORT TERM GOALS: Target date: 07/27/2024    Patient will be independent with HEP in order to improve functional outcomes. Baseline: Goal status: INITIAL  2.  Patient will report at least 25% improvement in symptoms for improved quality of life. Baseline:  Goal  status: INITIAL    LONG TERM GOALS: Target date: 08/24/2024    Patient will report at least 75% improvement in symptoms for improved quality of life. Baseline:  Goal status: INITIAL  2.  Patient will improve UEFS score by at least 18 points in order to indicate improved tolerance to activity. Baseline:  Goal status: INITIAL  3.  Patient will have reduction in trigger points in R elbow for decrease in symptoms.. Baseline:  Goal status: INITIAL  4.  Patient will be able to return to all activities unrestricted for improved ability to perform work functions and participate with family.  Baseline:  Goal status: INITIAL  5.  Patient will demonstrate grade of 5/5 MMT grade in all tested musculature as evidence of improved strength to assist with lifting at home. Baseline:  Goal status: INITIAL    PLAN:  PT FREQUENCY: 1-2x/week  PT DURATION: 8 weeks  PLANNED INTERVENTIONS:[id number]- PT Re-evaluation, 97110-Therapeutic exercises, 97530- Therapeutic activity,  02887- Neuromuscular re-education, [phone]- Self Care, 02859- Manual therapy, [id number]- Gait training, [phone]- Orthotic Fit/training, [id number]- Canalith repositioning, [id number]- Aquatic Therapy, [id number]- Splinting, [id number]- Wound care (first 20 sq cm), 97598- Wound care (each additional 20 sq cm)Patient/Family education, Balance training, Stair training, Taping, Dry Needling, Joint mobilization, Joint manipulation, Spinal manipulation, Spinal mobilization, Scar mobilization, and DME instructions.  PLAN FOR NEXT SESSION: possibly manual for pain/tissue relaxation, elbow and wrist extension strength    Prentice RAMAN Kynsleigh Westendorf, PT, DPT 07/13/2024, 11:09 AM   For all possible CPT codes, reference the Planned Interventions line above.     Check all conditions that are expected to impact treatment: {Conditions expected to impact treatment:None of these apply   If treatment provided at initial evaluation, no treatment charged due to lack of authorization.       "

## 2024-07-20 ENCOUNTER — Ambulatory Visit (HOSPITAL_BASED_OUTPATIENT_CLINIC_OR_DEPARTMENT_OTHER): Admitting: Physical Therapy

## 2024-07-25 ENCOUNTER — Ambulatory Visit (HOSPITAL_BASED_OUTPATIENT_CLINIC_OR_DEPARTMENT_OTHER): Admitting: Physical Therapy

## 2024-07-27 ENCOUNTER — Encounter (HOSPITAL_BASED_OUTPATIENT_CLINIC_OR_DEPARTMENT_OTHER): Admitting: Physical Therapy

## 2024-07-28 ENCOUNTER — Ambulatory Visit: Admitting: Allergy

## 2024-08-03 ENCOUNTER — Encounter (HOSPITAL_BASED_OUTPATIENT_CLINIC_OR_DEPARTMENT_OTHER): Admitting: Physical Therapy

## 2024-08-08 ENCOUNTER — Encounter (HOSPITAL_BASED_OUTPATIENT_CLINIC_OR_DEPARTMENT_OTHER): Admitting: Physical Therapy

## 2024-08-10 ENCOUNTER — Encounter (HOSPITAL_BASED_OUTPATIENT_CLINIC_OR_DEPARTMENT_OTHER): Admitting: Physical Therapy

## 2024-08-15 ENCOUNTER — Encounter (HOSPITAL_BASED_OUTPATIENT_CLINIC_OR_DEPARTMENT_OTHER): Admitting: Physical Therapy

## 2024-08-17 ENCOUNTER — Encounter (HOSPITAL_BASED_OUTPATIENT_CLINIC_OR_DEPARTMENT_OTHER): Admitting: Physical Therapy

## 2024-08-22 ENCOUNTER — Encounter (HOSPITAL_BASED_OUTPATIENT_CLINIC_OR_DEPARTMENT_OTHER): Admitting: Physical Therapy

## 2024-08-25 ENCOUNTER — Encounter (HOSPITAL_BASED_OUTPATIENT_CLINIC_OR_DEPARTMENT_OTHER): Admitting: Physical Therapy
